# Patient Record
Sex: Female | Born: 1950
Health system: Southern US, Community
[De-identification: ages and names within clinical notes are randomized; demographics above are authoritative.]

## PROBLEM LIST (undated history)

## (undated) DIAGNOSIS — G4700 Insomnia, unspecified: Secondary | ICD-10-CM

## (undated) DIAGNOSIS — R480 Dyslexia and alexia: Secondary | ICD-10-CM

## (undated) DIAGNOSIS — E785 Hyperlipidemia, unspecified: Secondary | ICD-10-CM

## (undated) DIAGNOSIS — Z8719 Personal history of other diseases of the digestive system: Secondary | ICD-10-CM

## (undated) DIAGNOSIS — I10 Essential (primary) hypertension: Secondary | ICD-10-CM

## (undated) DIAGNOSIS — Z860101 Personal history of adenomatous and serrated colon polyps: Secondary | ICD-10-CM

## (undated) DIAGNOSIS — E039 Hypothyroidism, unspecified: Secondary | ICD-10-CM

## (undated) DIAGNOSIS — M8580 Other specified disorders of bone density and structure, unspecified site: Secondary | ICD-10-CM

## (undated) DIAGNOSIS — R609 Edema, unspecified: Secondary | ICD-10-CM

## (undated) DIAGNOSIS — Z86010 Personal history of colonic polyps: Secondary | ICD-10-CM

## (undated) HISTORY — PX: ABDOMINAL HYSTERECTOMY: SHX81

## (undated) HISTORY — DX: Essential (primary) hypertension: I10

## (undated) HISTORY — DX: Personal history of adenomatous and serrated colon polyps: Z86.0101

## (undated) HISTORY — DX: Other specified disorders of bone density and structure, unspecified site: M85.80

## (undated) HISTORY — DX: Personal history of colonic polyps: Z86.010

## (undated) HISTORY — DX: Hyperlipidemia, unspecified: E78.5

## (undated) HISTORY — DX: Hypothyroidism, unspecified: E03.9

## (undated) HISTORY — DX: Personal history of other diseases of the digestive system: Z87.19

## (undated) HISTORY — DX: Insomnia, unspecified: G47.00

## (undated) HISTORY — DX: Dyslexia and alexia: R48.0

## (undated) HISTORY — DX: Edema, unspecified: R60.9

---

## 2003-01-04 ENCOUNTER — Emergency Department (HOSPITAL_COMMUNITY): Admission: EM | Admit: 2003-01-04 | Discharge: 2003-01-04 | Payer: Self-pay | Admitting: Emergency Medicine

## 2003-01-04 ENCOUNTER — Encounter: Payer: Self-pay | Admitting: Emergency Medicine

## 2003-09-09 ENCOUNTER — Encounter: Admission: RE | Admit: 2003-09-09 | Discharge: 2003-09-09 | Payer: Self-pay | Admitting: Internal Medicine

## 2004-08-10 ENCOUNTER — Ambulatory Visit: Payer: Self-pay | Admitting: Internal Medicine

## 2004-08-14 ENCOUNTER — Ambulatory Visit: Payer: Self-pay | Admitting: Internal Medicine

## 2004-08-31 ENCOUNTER — Ambulatory Visit: Payer: Self-pay | Admitting: Internal Medicine

## 2005-04-28 ENCOUNTER — Emergency Department (HOSPITAL_COMMUNITY): Admission: EM | Admit: 2005-04-28 | Discharge: 2005-04-28 | Payer: Self-pay | Admitting: Emergency Medicine

## 2006-03-31 ENCOUNTER — Ambulatory Visit: Payer: Self-pay | Admitting: Internal Medicine

## 2006-09-26 ENCOUNTER — Other Ambulatory Visit: Admission: RE | Admit: 2006-09-26 | Discharge: 2006-09-26 | Payer: Self-pay | Admitting: Internal Medicine

## 2007-02-11 ENCOUNTER — Encounter: Admission: RE | Admit: 2007-02-11 | Discharge: 2007-02-11 | Payer: Self-pay | Admitting: Internal Medicine

## 2008-05-11 IMAGING — MG MM SCREEN MAMMOGRAM BILATERAL
1 series · 1 of 1 positions shown · non-contrast
Comparison: none

DG SCREEN MAMMOGRAM BILATERAL
Bilateral CC and MLO view(s) were taken.
Technologist: Akshar Chaidez

DIGITAL SCREENING MAMMOGRAM WITH CAD:
There is a fibroglandular pattern.  No masses or malignant type calcifications are identified.  
Compared with prior studies.

[L MLO]
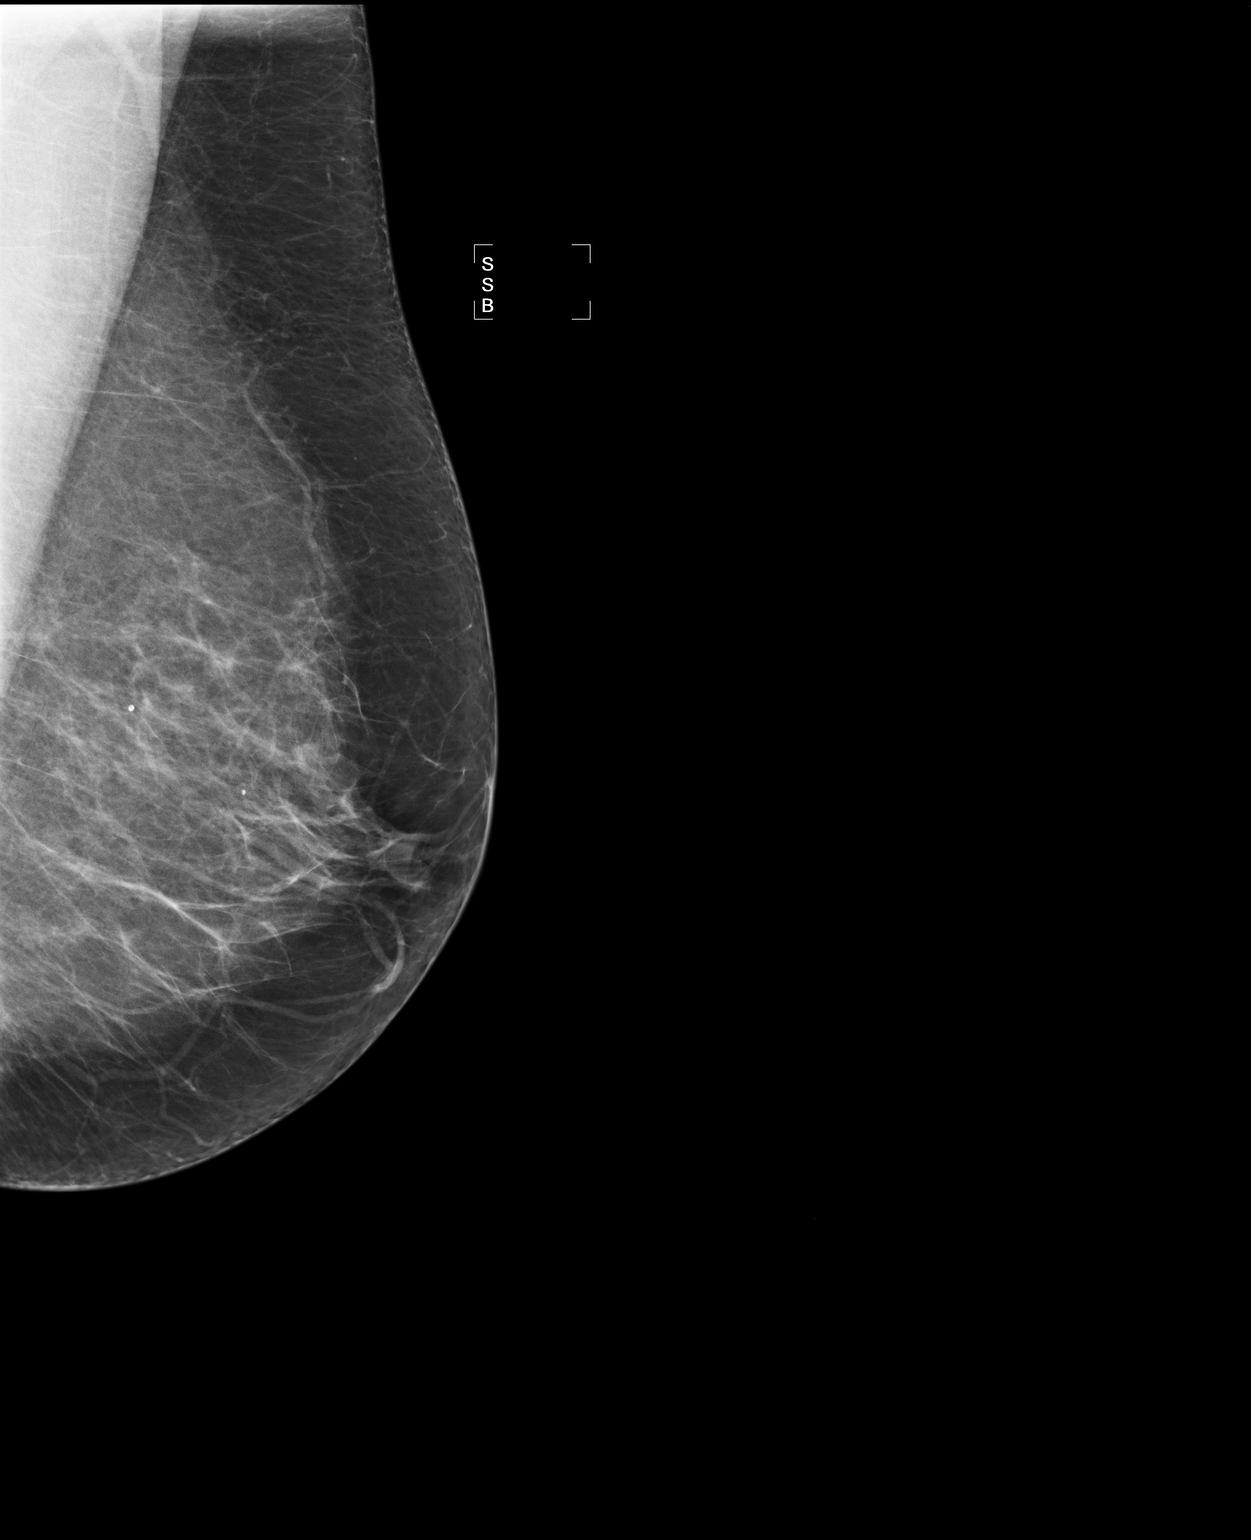

[1 of 1 positions shown; findings below may reference images not displayed]

IMPRESSION: No specific mammographic evidence of malignancy.  Next screening mammogram is recommended in one 
year.

ASSESSMENT: Negative - BI-RADS 1

Screening mammogram in 1 year.
ANALYZED BY COMPUTER AIDED DETECTION. , THIS PROCEDURE WAS A DIGITAL MAMMOGRAM.

## 2009-03-09 ENCOUNTER — Ambulatory Visit: Payer: Self-pay | Admitting: Internal Medicine

## 2009-03-14 ENCOUNTER — Ambulatory Visit: Payer: Self-pay | Admitting: Internal Medicine

## 2010-06-15 ENCOUNTER — Encounter: Admission: RE | Admit: 2010-06-15 | Discharge: 2010-06-15 | Payer: Self-pay | Admitting: Internal Medicine

## 2010-06-29 ENCOUNTER — Ambulatory Visit: Payer: Self-pay | Admitting: Internal Medicine

## 2010-09-03 ENCOUNTER — Ambulatory Visit: Payer: Self-pay | Admitting: Internal Medicine

## 2010-10-15 ENCOUNTER — Ambulatory Visit
Admission: RE | Admit: 2010-10-15 | Discharge: 2010-10-15 | Payer: Self-pay | Source: Home / Self Care | Attending: Internal Medicine | Admitting: Internal Medicine

## 2010-11-09 ENCOUNTER — Ambulatory Visit (INDEPENDENT_AMBULATORY_CARE_PROVIDER_SITE_OTHER): Payer: BC Managed Care – PPO | Admitting: Internal Medicine

## 2010-11-09 DIAGNOSIS — I10 Essential (primary) hypertension: Secondary | ICD-10-CM

## 2011-02-21 ENCOUNTER — Other Ambulatory Visit: Payer: BC Managed Care – PPO | Admitting: Internal Medicine

## 2011-02-21 DIAGNOSIS — Z0000 Encounter for general adult medical examination without abnormal findings: Secondary | ICD-10-CM

## 2011-02-21 DIAGNOSIS — E7800 Pure hypercholesterolemia, unspecified: Secondary | ICD-10-CM

## 2011-02-21 DIAGNOSIS — Z79899 Other long term (current) drug therapy: Secondary | ICD-10-CM

## 2011-02-21 LAB — COMPREHENSIVE METABOLIC PANEL
ALT: 29 U/L (ref 0–35)
AST: 30 U/L (ref 0–37)
Albumin: 4.3 g/dL (ref 3.5–5.2)
Alkaline Phosphatase: 70 U/L (ref 39–117)
BUN: 12 mg/dL (ref 6–23)
CO2: 28 mEq/L (ref 19–32)
Calcium: 9.5 mg/dL (ref 8.4–10.5)
Chloride: 104 mEq/L (ref 96–112)
Creat: 0.82 mg/dL (ref 0.40–1.20)
Glucose, Bld: 99 mg/dL (ref 70–99)
Potassium: 4.4 mEq/L (ref 3.5–5.3)
Sodium: 139 mEq/L (ref 135–145)
Total Bilirubin: 0.5 mg/dL (ref 0.3–1.2)
Total Protein: 6.4 g/dL (ref 6.0–8.3)

## 2011-02-21 LAB — TSH: TSH: 2.663 u[IU]/mL (ref 0.350–4.500)

## 2011-02-21 LAB — LIPID PANEL
Cholesterol: 205 mg/dL — ABNORMAL HIGH (ref 0–200)
HDL: 55 mg/dL (ref >39)
LDL Cholesterol: 132 mg/dL — ABNORMAL HIGH (ref 0–99)
Total CHOL/HDL Ratio: 3.7 Ratio
Triglycerides: 88 mg/dL (ref <150)
VLDL: 18 mg/dL (ref 0–40)

## 2011-02-22 ENCOUNTER — Ambulatory Visit (INDEPENDENT_AMBULATORY_CARE_PROVIDER_SITE_OTHER): Payer: BC Managed Care – PPO | Admitting: Internal Medicine

## 2011-02-22 ENCOUNTER — Encounter: Payer: Self-pay | Admitting: Internal Medicine

## 2011-02-22 DIAGNOSIS — Z0000 Encounter for general adult medical examination without abnormal findings: Secondary | ICD-10-CM

## 2011-02-22 DIAGNOSIS — G4700 Insomnia, unspecified: Secondary | ICD-10-CM

## 2011-02-22 DIAGNOSIS — M8580 Other specified disorders of bone density and structure, unspecified site: Secondary | ICD-10-CM

## 2011-02-22 DIAGNOSIS — I10 Essential (primary) hypertension: Secondary | ICD-10-CM | POA: Insufficient documentation

## 2011-02-22 DIAGNOSIS — M899 Disorder of bone, unspecified: Secondary | ICD-10-CM

## 2011-02-22 DIAGNOSIS — R480 Dyslexia and alexia: Secondary | ICD-10-CM | POA: Insufficient documentation

## 2011-02-22 DIAGNOSIS — R609 Edema, unspecified: Secondary | ICD-10-CM

## 2011-02-22 LAB — POCT URINALYSIS DIPSTICK
Bilirubin, UA: NEGATIVE
Blood, UA: NEGATIVE
Glucose, UA: NEGATIVE
Ketones, UA: NEGATIVE
Leukocytes, UA: NEGATIVE
Nitrite, UA: NEGATIVE
Protein, UA: NEGATIVE
Spec Grav, UA: 1
Urobilinogen, UA: NEGATIVE
pH, UA: 7

## 2011-03-23 NOTE — Patient Instructions (Signed)
Continue medications as prescribed. Return to office in 6 months

## 2011-03-23 NOTE — Progress Notes (Signed)
  Subjective:    Patient ID: Anna Sanchez, female    DOB: 1951-08-23, 60 y.o.   MRN: [id number]  HPI pleasant white female for evaluation of medical problems. In December 2011 we started her on Cozaar 50 mg daily. Her blood pressure was running 146-160 systolically and 88-90 diastolically. In January 2012, we increase Cozaar to 100 mg daily. Blood pressure came under good control and was 136/88 in February 2012. Patient has history of left plantar fasciitis. Says she had depression 5 years ago but is fine now. She works as a Agricultural engineer. Total abdominal hysterectomy about lateral salpingo-oophorectomy around 1990. Tetanus immunization given 2006. No known drug allergies   Review of Systems  Constitutional: Negative.   HENT: Negative.   Eyes: Negative.   Respiratory: Negative.   Cardiovascular: Negative.   Gastrointestinal: Negative.   Genitourinary: Negative.   Musculoskeletal: Negative.   Neurological: Negative.   Hematological: Negative.   Psychiatric/Behavioral: Negative.        Objective:   Physical Exam  Constitutional: She is oriented to person, place, and time. No distress.  HENT:  Head: Normocephalic and atraumatic.  Right Ear: External ear normal.  Left Ear: External ear normal.  Mouth/Throat: Oropharynx is clear and moist.  Eyes: Conjunctivae and EOM are normal. Pupils are equal, round, and reactive to light. No scleral icterus.  Neck: Neck supple. No JVD present. No thyromegaly present.  Cardiovascular: Normal rate, regular rhythm and normal heart sounds.   No murmur heard. Pulmonary/Chest: Effort normal and breath sounds normal. She has no wheezes. She has no rales.  Abdominal: Soft. Bowel sounds are normal. She exhibits no mass. There is no tenderness.  Musculoskeletal: She exhibits no edema.  Lymphadenopathy:    She has no cervical adenopathy.  Neurological: She is alert and oriented to person, place, and time. She has normal reflexes. No cranial nerve deficit.  Coordination normal.  Skin: Skin is warm and dry. No rash noted.  Psychiatric: She has a normal mood and affect. Her behavior is normal. Thought content normal.          Assessment & Plan:  Hypertension  Osteopenia treat with vitamin D supplement and calcium supplement bone density study done in 2008 consider repeating next year  Dependent edema-stable  Insomnia  Plan is to return in 6 months for repeat blood pressure check and office visit

## 2011-04-22 ENCOUNTER — Other Ambulatory Visit: Payer: Self-pay | Admitting: Internal Medicine

## 2011-05-28 ENCOUNTER — Other Ambulatory Visit: Payer: BC Managed Care – PPO | Admitting: Internal Medicine

## 2011-05-28 DIAGNOSIS — E785 Hyperlipidemia, unspecified: Secondary | ICD-10-CM

## 2011-05-28 LAB — LIPID PANEL
Cholesterol: 187 mg/dL (ref 0–200)
HDL: 53 mg/dL (ref >39)
LDL Cholesterol: 113 mg/dL — ABNORMAL HIGH (ref 0–99)
Total CHOL/HDL Ratio: 3.5 Ratio
Triglycerides: 105 mg/dL (ref <150)
VLDL: 21 mg/dL (ref 0–40)

## 2011-05-30 ENCOUNTER — Encounter: Payer: Self-pay | Admitting: Internal Medicine

## 2011-07-23 ENCOUNTER — Other Ambulatory Visit: Payer: Self-pay | Admitting: Internal Medicine

## 2011-08-26 ENCOUNTER — Ambulatory Visit: Payer: BC Managed Care – PPO | Admitting: Internal Medicine

## 2011-09-06 ENCOUNTER — Encounter: Payer: Self-pay | Admitting: Internal Medicine

## 2011-09-06 ENCOUNTER — Ambulatory Visit (INDEPENDENT_AMBULATORY_CARE_PROVIDER_SITE_OTHER): Payer: BC Managed Care – PPO | Admitting: Internal Medicine

## 2011-09-06 DIAGNOSIS — I10 Essential (primary) hypertension: Secondary | ICD-10-CM

## 2011-09-06 DIAGNOSIS — E785 Hyperlipidemia, unspecified: Secondary | ICD-10-CM

## 2011-09-13 IMAGING — MG MM DIGITAL SCREENING
6 series · 6 of 6 positions shown · non-contrast
Comparison: none

DG SCREEN MAMMOGRAM BILATERAL
Bilateral CC and MLO view(s) were taken.

DIGITAL SCREENING MAMMOGRAM WITH CAD:
There are scattered fibroglandular densities.  No masses or malignant type calcifications are 
identified.  Compared with prior studies.
Images were processed with CAD.

[R CC]
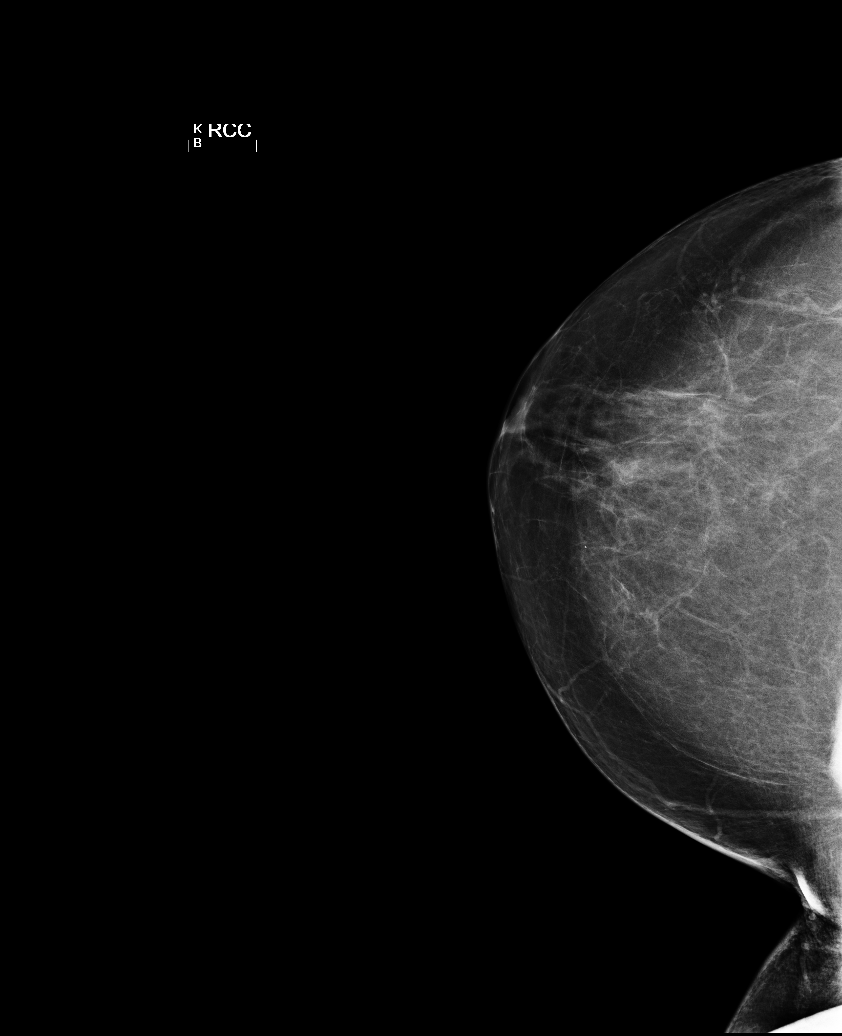

[L CC]
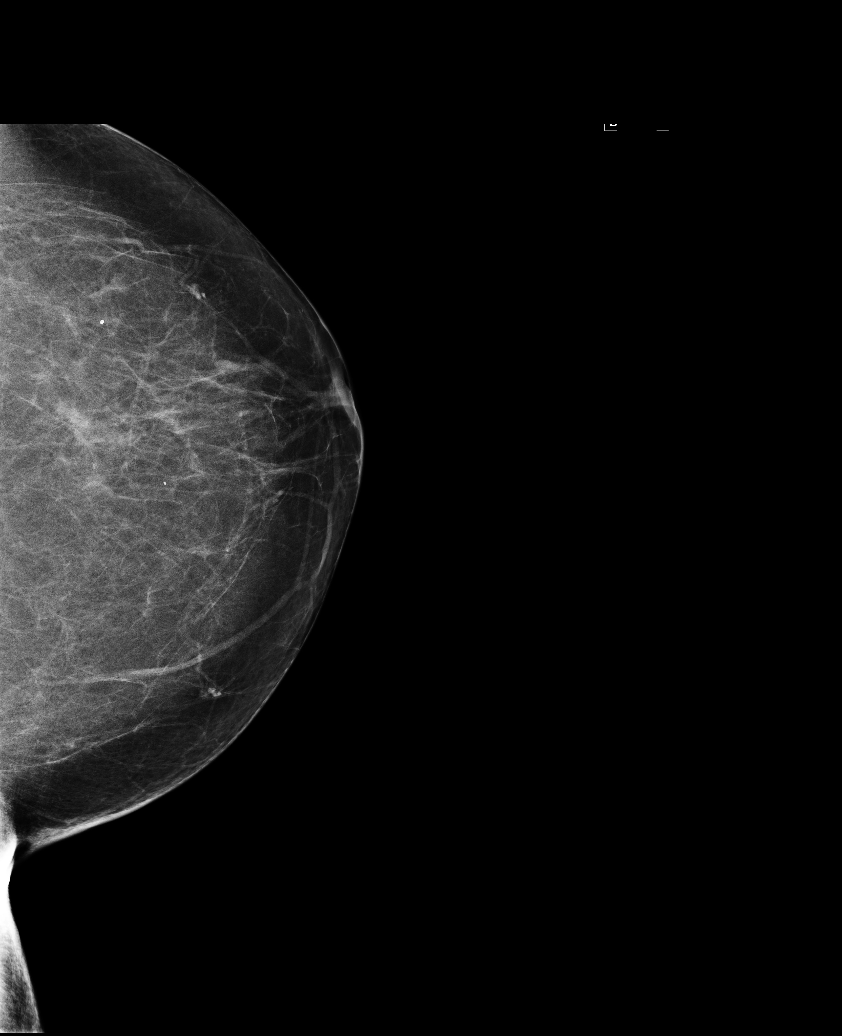

[L MLO]
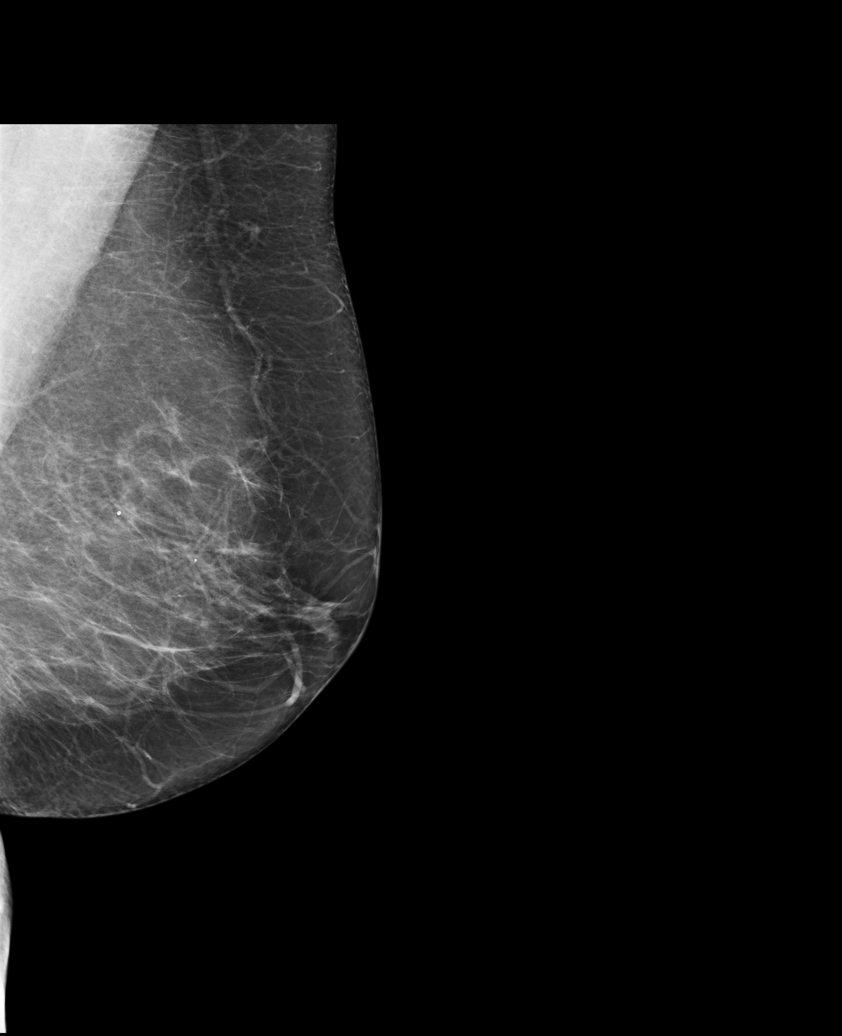

[R MLO (1 of 3)]
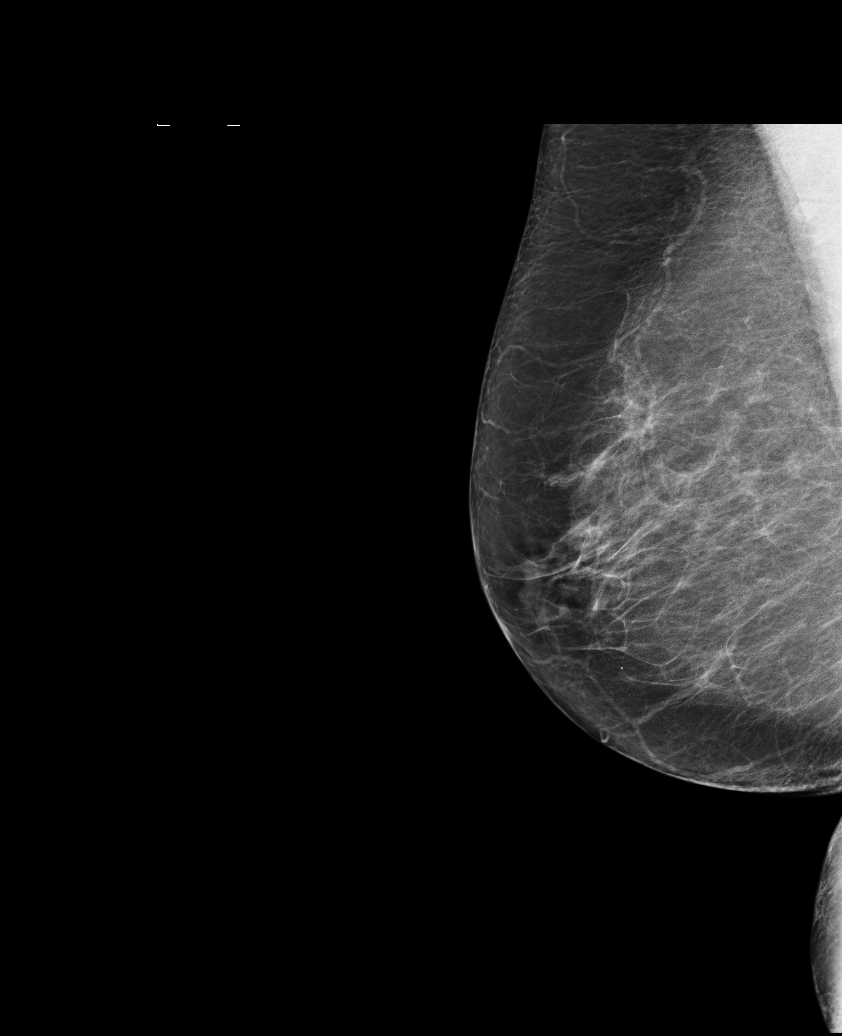

[R MLO (2 of 3)]
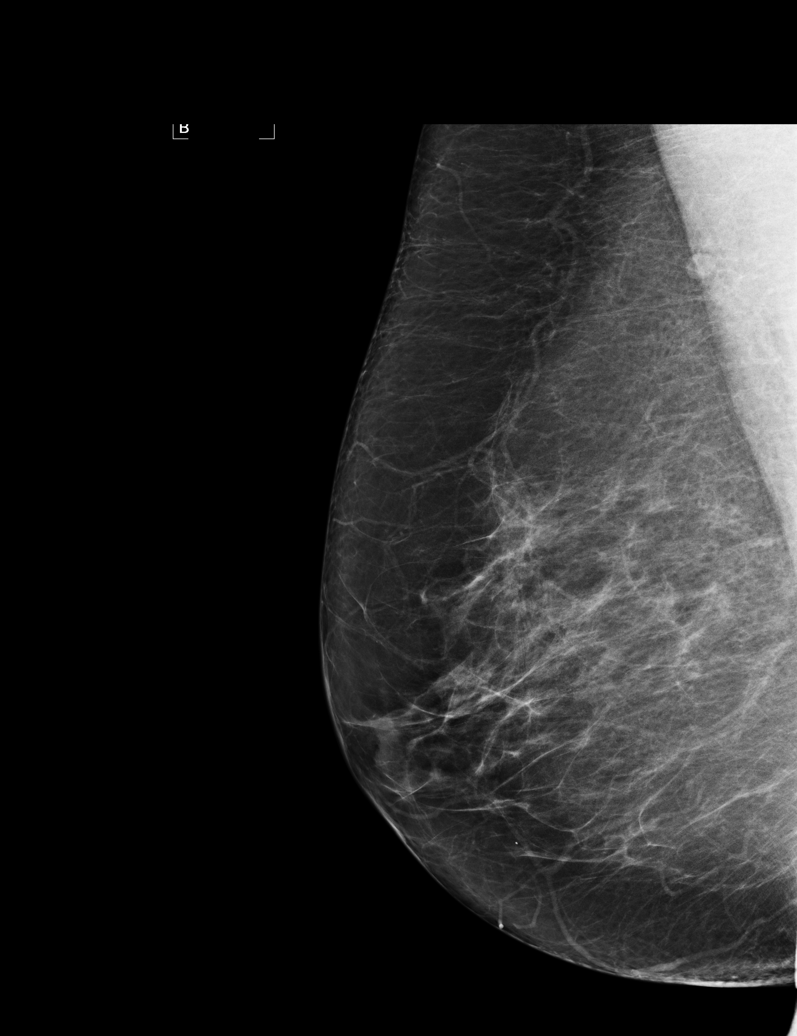

[R MLO (3 of 3)]
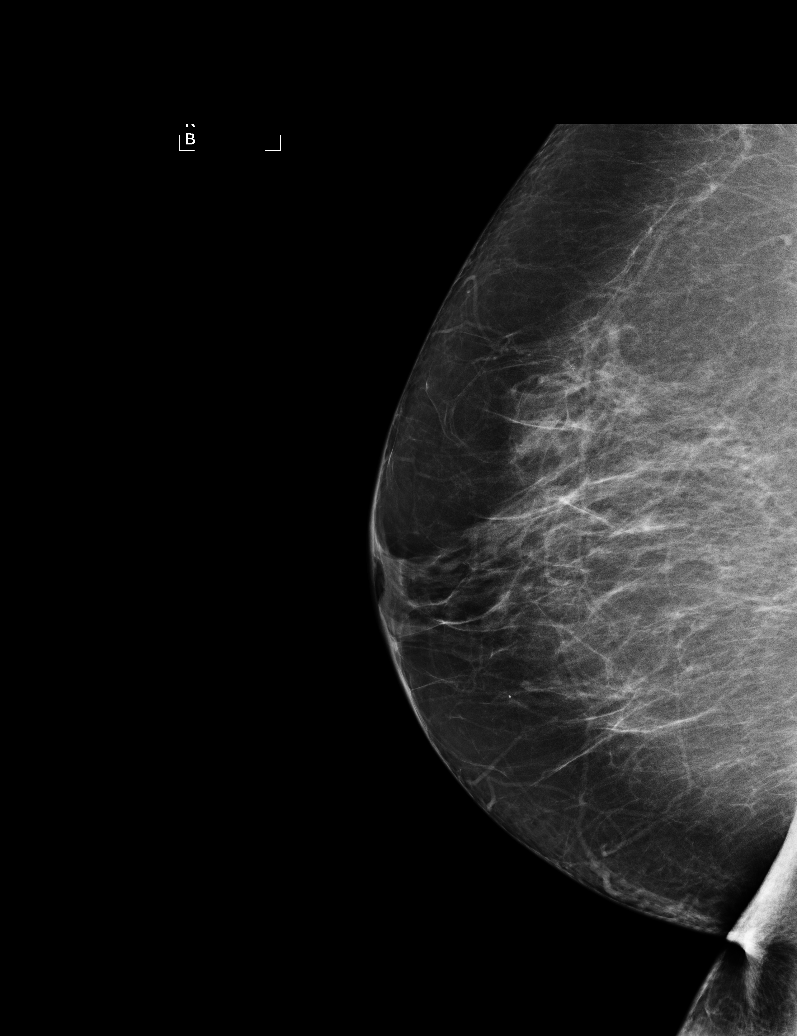

[6 of 6 positions shown; findings below may reference images not displayed]

IMPRESSION: No specific mammographic evidence of malignancy.  Next screening mammogram is recommended in one 
year.

A result letter of this screening mammogram will be mailed directly to the patient.

ASSESSMENT: Negative - BI-RADS 1

Screening mammogram in 1 year.
,

## 2011-09-22 ENCOUNTER — Encounter: Payer: Self-pay | Admitting: Internal Medicine

## 2011-09-22 DIAGNOSIS — E785 Hyperlipidemia, unspecified: Secondary | ICD-10-CM | POA: Insufficient documentation

## 2011-09-22 NOTE — Patient Instructions (Signed)
Continue antihypertensive medication as prescribed. Continue diet and exercise. Return in 6 months

## 2011-09-22 NOTE — Progress Notes (Signed)
  Subjective:    Patient ID: Anna Sanchez, female    DOB: Feb 12, 1951, 60 y.o.   MRN: [id number]  HPI 60 year old white female with history of hypertension in today for six-month recheck. Also has history of hyperlipidemia currently controlled with diet. Feels well with no complaints    Review of Systems     Objective:   Physical Exam chest clear to auscultation; cardiac exam regular rate and rhythm normal S1 and S2 without murmur; extremities without edema        Assessment & Plan:  Hypertension  Hyperlipidemia  Plan: continue current regimen and return in 6 months for physical exam.

## 2011-10-23 ENCOUNTER — Other Ambulatory Visit: Payer: Self-pay | Admitting: Internal Medicine

## 2012-02-19 ENCOUNTER — Encounter (HOSPITAL_COMMUNITY): Payer: Self-pay

## 2012-02-19 ENCOUNTER — Emergency Department (HOSPITAL_COMMUNITY)
Admission: EM | Admit: 2012-02-19 | Discharge: 2012-02-19 | Disposition: A | Discharge status: Home / Self Care | Payer: BC Managed Care – PPO | Source: Home / Self Care | Attending: Emergency Medicine | Admitting: Emergency Medicine

## 2012-02-19 DIAGNOSIS — T148XXA Other injury of unspecified body region, initial encounter: Secondary | ICD-10-CM

## 2012-02-19 DIAGNOSIS — W57XXXA Bitten or stung by nonvenomous insect and other nonvenomous arthropods, initial encounter: Secondary | ICD-10-CM

## 2012-02-19 NOTE — ED Notes (Signed)
States she found 6 attached ticks yesterday, could not get in to see her MD today; concerned she needs to be on medication to prevent RMSF

## 2012-02-19 NOTE — Discharge Instructions (Signed)
Wood Tick Bite Ticks are insects that attach themselves to the skin. Most tick bites are harmless, but sometimes ticks carry diseases that can make a person quite ill. The chance of getting ill depends on:  The kind of tick that bites you.   Time of year.   How long the tick is attached.   Geographic location.  Wood ticks are also called dog ticks. They are generally black. They can have white markings. They live in shrubs and grassy areas. They are larger than deer ticks. Wood ticks are about the size of a watermelon seed. They have a hard body. The most common places for ticks to attach themselves are the scalp, neck, armpits, waist, and groin. Wood ticks may stay attached for up to 2 weeks. TICKS MUST BE REMOVED AS SOON AS POSSIBLE TO HELP PREVENT DISEASES CAUSED BY TICK BITES.  TO REMOVE A TICK: 1. If available, put on latex gloves before trying to remove a tick.  2. Grasp the tick as close to the skin as possible, with curved forceps, fine tweezers or a special tick removal tool.  3. Pull gently with steady pressure until the tick lets go. Do not twist the tick or jerk it suddenly. This may break off the tick's head or mouth parts.  4. Do not crush the tick's body. This could force disease-carrying fluids from the tick into your body.  5. After the tick is removed, wash the bite area and your hands with soap and water or other disinfectant.  6. Apply a small amount of antiseptic cream or ointment to the bite site.  7. Wash and disinfect any instruments that were used.  8. Save the tick in a jar or plastic bag for later identification. Preserve the tick with a bit of alcohol or put it in the freezer.  9. Do not apply a hot match, petroleum jelly, or fingernail polish to the tick. This does not work and may increase the chances of disease from the tick bite.  YOU MAY NEED TO SEE YOUR CAREGIVER FOR A TETANUS SHOT NOW IF:  You have no idea when you had the last one.   You have never had a  tetanus shot before.  If you need a tetanus shot, and you decide not to get one, there is a rare chance of getting tetanus. Sickness from tetanus can be serious. If you get a tetanus shot, your arm may swell, get red and warm to the touch at the shot site. This is common and not a problem. PREVENTION  Wear protective clothing. Long sleeves and pants are best.   Wear white clothes to see ticks more easily   Tuck your pant legs into your socks.   If walking on trail, stay in the middle of the trail to avoid brushing against bushes.   Put insect repellent on all exposed skin and along boot tops, pant legs and sleeve cuffs   Check clothing, hair and skin repeatedly and before coming inside.   Brush off any ticks that are not attached.  SEEK MEDICAL CARE IF:   You cannot remove a tick or part of the tick that is left in the skin.   Unexplained fever.   Redness and swelling in the area of the tick bite.   Tender, swollen lymph glands.   Diarrhea.   Weight loss.   Cough.   Fatigue.   Muscle, joint or bone pain.   Belly pain.   Headache.   Rash.    SEEK IMMEDIATE MEDICAL CARE IF:   You develop an oral temperature above 102 F (38.9 C).   You are having trouble walking or moving your legs.   Numbness in the legs.   Shortness of breath.   Confusion.   Repeated vomiting.  Document Released: 09/06/2000 Document Revised: 08/29/2011 Document Reviewed: 08/15/2008 Exit[employer] Patient Information 2012 [employer].  Deer Tick Bite Deer ticks are brown arachnids (spider family) that vary in size from as small as the head of a pin to 1/4 inch (1/2 cm) diameter. They thrive in wooded areas. Deer are the preferred host of adult deer ticks. Small rodents are the host of young ticks (nymphs). When a person walks in a field or wooded area, young and adult ticks in the surrounding grass and vegetation can attach themselves to the skin. They can suck blood for hours to days if  unnoticed. Ticks are found all over the [place] Some ticks carry a specific bacteria (Borrelia burgdorferi) that causes an infection called Lyme disease. The bacteria is typically passed into a person during the blood sucking process. This happens after the tick has been attached for at least a number of hours. While ticks can be found all over the [place] those carrying the bacteria that causes Lyme disease are most common in [place] and the [place]. Only a small proportion of ticks in these areas carry the Lyme disease bacteria and cause human infections. Ticks usually attach to warm spots on the body, such as the:  Head.   Back.   Neck.   Armpits.   Groin.  SYMPTOMS  Most of the time, a deer tick bite will not be felt. You may or may not see the attached tick. You may notice mild irritation or redness around the bite site. If the deer tick passes the Lyme disease bacteria to a person, a round, red rash may be noticed 2 to 3 days after the bite. The rash may be clear in the middle, like a bull's-eye or target. If not treated, other symptoms may develop several days to weeks after the onset of the rash. These symptoms may include:  New rash lesions.   Fatigue and weakness.   General ill feeling and achiness.   Chills.   Headache and neck pain.   Swollen lymph glands.   Sore muscles and joints.  5 to 15% of untreated people with Lyme disease may develop more severe illnesses after several weeks to months. This may include inflammation of the brain lining (meningitis), nerve palsies, an abnormal heartbeat, or severe muscle and joint pain and inflammation (myositis or arthritis). DIAGNOSIS   Physical exam and medical history.   Viewing the tick if it was saved for confirmation.   Blood tests (to check or confirm the presence of Lyme disease).  TREATMENT  Most ticks do not carry disease. If found, an attached tick should be removed using tweezers. Tweezers should be placed under  the body of the tick so it is removed by its attachment parts (pincers). If there are signs or symptoms of being sick, or Lyme disease is confirmed, medicines (antibiotics) that kill germs are usually prescribed. In more severe cases, antibiotics may be given through an intravenous (IV) access. HOME CARE INSTRUCTIONS   Always remove ticks with tweezers. Do not use petroleum jelly or other methods to kill or remove the tick. Slide the tweezers under the body and pull out as much as you can. If you are not sure what it is,   save it in a jar and show your caregiver.   Once you remove the tick, the skin will heal on its own. Wash your hands and the affected area with water and soap. You may place a bandage on the affected area.   Take medicine as directed. You may be advised to take a full course of antibiotics.   Follow up with your caregiver as recommended.  FINDING OUT THE RESULTS OF YOUR TEST Not all test results are available during your visit. If your test results are not back during the visit, make an appointment with your caregiver to find out the results. Do not assume everything is normal if you have not heard from your caregiver or the medical facility. It is important for you to follow up on all of your test results. PROGNOSIS  If Lyme disease is confirmed, early treatment with antibiotics is very effective. Following preventive guidelines is important since it is possible to get the disease more than once. PREVENTION   Wear long sleeves and long pants in wooded or grassy areas. Tuck your pants into your socks.   Use an insect repellent while hiking.   Check yourself, your children, and your pets regularly for ticks after playing outside.   Clear piles of leaves or brush from your yard. Ticks might live there.  SEEK MEDICAL CARE IF:   You or your child has an oral temperature above 102 F (38.9 C).   You develop a severe headache following the bite.   You feel generally ill.    You notice a rash.   You are having trouble removing the tick.   The bite area has red skin or yellow drainage.  SEEK IMMEDIATE MEDICAL CARE IF:   Your face is weak and droopy or you have other neurological symptoms.   You have severe joint pain or weakness.  MAKE SURE YOU:   Understand these instructions.   Will watch your condition.   Will get help right away if you are not doing well or get worse.  FOR MORE INFORMATION Centers for Disease Control and Prevention: www.cdc.gov American Academy of Family Physicians: www.aafp.org Document Released: 12/04/2009 Document Revised: 08/29/2011 Document Reviewed: 12/04/2009 [employer] Patient Information 2012 [employer]. 

## 2012-02-19 NOTE — ED Provider Notes (Signed)
History     CSN: [id number]  Arrival date & time 02/19/12  4098   First MD Initiated Contact with Patient 02/19/12 1038      Chief Complaint  Patient presents with  . Tick Removal    (Consider location/radiation/quality/duration/timing/severity/associated sxs/prior treatment) Patient is a 61 y.o. female presenting with rash. The history is provided by the patient. No language interpreter was used.  Rash  This is a new problem. The current episode started yesterday. The problem has not changed since onset.The problem is associated with nothing. There has been no fever. The rash is present on the torso, back and abdomen. The pain is at a severity of 0/10. She has tried nothing for the symptoms.  Pt has red spots where she pulled off ticks.  Pt reports ticks were small.  Pt was in [employer]  Past Medical History  Diagnosis Date  . Dyslexia   . Insomnia   . Osteopenia   . Hypertension   . Dependent edema     Past Surgical History  Procedure Date  . Abdominal hysterectomy     Family History  Problem Relation Age of Onset  . Diabetes Father   . Alcohol abuse Father     History  Substance Use Topics  . Smoking status: Former Smoker -- 1.0 packs/day for 33 years    Types: Cigarettes    Quit date: 02/20/2000  . Smokeless tobacco: Former Neurosurgeon    Quit date: 10/12/2010  . Alcohol Use: No    OB History    Grav Para Term Preterm Abortions TAB SAB Ect Mult Living                  Review of Systems  Skin: Negative for rash.  All other systems reviewed and are negative.    Allergies  Review of patient's allergies indicates no known allergies.  Home Medications   Current Outpatient Rx  Name Route Sig Dispense Refill  . ASPIRIN 81 MG PO CHEW Oral Chew 81 mg by mouth daily. 2 tabs Qhs     . B COMPLEX PO TABS Oral Take 1 tablet by mouth daily.      Marland Kitchen CALCIUM MAGNESIUM PO Oral Take 1 tablet by mouth daily.      . IBUPROFEN 200 MG PO TABS Oral Take 200 mg by  mouth every 6 (six) hours as needed.      Marland Kitchen LOSARTAN POTASSIUM 100 MG PO TABS  TAKE 1 TABLET BY MOUTH EVERY DAY FOR BLOOD PRESSURE 30 tablet 6  . ONE-DAILY MULTI VITAMINS PO TABS Oral Take 1 tablet by mouth daily.      Marland Kitchen FISH OIL 1000 MG PO CAPS Oral Take 1 capsule by mouth daily.        BP 121/67  Pulse 64  Temp(Src) 98.7 F (37.1 C) (Oral)  Resp 10  SpO2 100%  Physical Exam  Nursing note and vitals reviewed. Constitutional: She is oriented to person, place, and time. She appears well-developed and well-nourished.  HENT:  Head: Normocephalic.  Musculoskeletal: Normal range of motion.  Neurological: She is alert and oriented to person, place, and time. She has normal reflexes.  Skin: Skin is warm and dry.       Small red areas at sites of removal of ticks.  No rash,  Psychiatric: She has a normal mood and affect.    ED Course  Procedures (including critical care time)  Labs Reviewed - No data to display No results found.  No diagnosis found.    MDM  Pt counseled on watching for tick related illness.  Pt advised to follow up with Dr. Lenord Fellers if fever, headache, rash        Lonia Skinner [place], [place] 02/19/12 1059  Lonia Skinner [place], [place] 02/20/12 [phone]

## 2012-02-26 NOTE — ED Provider Notes (Signed)
Medical screening examination/treatment/procedure(s) were performed by non-physician practitioner and as supervising physician I was immediately available for consultation/collaboration.  Luiz Blare MD   Luiz Blare, MD 02/26/12 2122

## 2012-03-09 ENCOUNTER — Other Ambulatory Visit: Payer: BC Managed Care – PPO | Admitting: Internal Medicine

## 2012-03-10 ENCOUNTER — Encounter: Payer: BC Managed Care – PPO | Admitting: Internal Medicine

## 2012-04-24 ENCOUNTER — Other Ambulatory Visit: Payer: BC Managed Care – PPO | Admitting: Internal Medicine

## 2012-04-24 DIAGNOSIS — E785 Hyperlipidemia, unspecified: Secondary | ICD-10-CM

## 2012-04-24 DIAGNOSIS — I10 Essential (primary) hypertension: Secondary | ICD-10-CM

## 2012-04-24 DIAGNOSIS — Z0000 Encounter for general adult medical examination without abnormal findings: Secondary | ICD-10-CM

## 2012-04-24 LAB — CBC WITH DIFFERENTIAL/PLATELET
Basophils Absolute: 0.1 10*3/uL (ref 0.0–0.1)
Basophils Relative: 1 % (ref 0–1)
Eosinophils Absolute: 0.1 10*3/uL (ref 0.0–0.7)
Eosinophils Relative: 2 % (ref 0–5)
HCT: 39.4 % (ref 36.0–46.0)
Hemoglobin: 13.3 g/dL (ref 12.0–15.0)
Lymphocytes Relative: 31 % (ref 12–46)
Lymphs Abs: 1.4 10*3/uL (ref 0.7–4.0)
MCH: 31 pg (ref 26.0–34.0)
MCHC: 33.8 g/dL (ref 30.0–36.0)
MCV: 91.8 fL (ref 78.0–100.0)
Monocytes Absolute: 0.3 10*3/uL (ref 0.1–1.0)
Monocytes Relative: 7 % (ref 3–12)
Neutro Abs: 2.5 10*3/uL (ref 1.7–7.7)
Neutrophils Relative %: 59 % (ref 43–77)
Platelets: 201 10*3/uL (ref 150–400)
RBC: 4.29 MIL/uL (ref 3.87–5.11)
RDW: 13.4 % (ref 11.5–15.5)
WBC: 4.3 10*3/uL (ref 4.0–10.5)

## 2012-04-24 LAB — COMPREHENSIVE METABOLIC PANEL
ALT: 23 U/L (ref 0–35)
AST: 27 U/L (ref 0–37)
Albumin: 4.4 g/dL (ref 3.5–5.2)
Alkaline Phosphatase: 74 U/L (ref 39–117)
BUN: 14 mg/dL (ref 6–23)
CO2: 29 mEq/L (ref 19–32)
Calcium: 9.2 mg/dL (ref 8.4–10.5)
Chloride: 104 mEq/L (ref 96–112)
Creat: 0.71 mg/dL (ref 0.50–1.10)
Glucose, Bld: 87 mg/dL (ref 70–99)
Potassium: 4.8 mEq/L (ref 3.5–5.3)
Sodium: 139 mEq/L (ref 135–145)
Total Bilirubin: 0.5 mg/dL (ref 0.3–1.2)
Total Protein: 6.3 g/dL (ref 6.0–8.3)

## 2012-04-24 LAB — LIPID PANEL
Cholesterol: 198 mg/dL (ref 0–200)
HDL: 48 mg/dL (ref >39)
LDL Cholesterol: 133 mg/dL — ABNORMAL HIGH (ref 0–99)
Total CHOL/HDL Ratio: 4.1 Ratio
Triglycerides: 86 mg/dL (ref <150)
VLDL: 17 mg/dL (ref 0–40)

## 2012-04-24 LAB — TSH: TSH: 3.348 u[IU]/mL (ref 0.350–4.500)

## 2012-04-25 LAB — VITAMIN D 25 HYDROXY (VIT D DEFICIENCY, FRACTURES): Vit D, 25-Hydroxy: 37 ng/mL (ref 30–89)

## 2012-04-27 ENCOUNTER — Ambulatory Visit (INDEPENDENT_AMBULATORY_CARE_PROVIDER_SITE_OTHER): Payer: BC Managed Care – PPO | Admitting: Internal Medicine

## 2012-04-27 ENCOUNTER — Encounter: Payer: Self-pay | Admitting: Internal Medicine

## 2012-04-27 VITALS — BP 130/82 | HR 76 | Temp 97.9°F | Ht 67.5 in | Wt 203.0 lb

## 2012-04-27 DIAGNOSIS — I10 Essential (primary) hypertension: Secondary | ICD-10-CM

## 2012-04-27 LAB — POCT URINALYSIS DIPSTICK
Bilirubin, UA: NEGATIVE
Blood, UA: NEGATIVE
Glucose, UA: NEGATIVE
Ketones, UA: NEGATIVE
Leukocytes, UA: NEGATIVE
Nitrite, UA: NEGATIVE
Protein, UA: NEGATIVE
Spec Grav, UA: <=1.005
Urobilinogen, UA: NEGATIVE
pH, UA: 7

## 2012-04-27 NOTE — Progress Notes (Signed)
Subjective:    Patient ID: Anna Sanchez, female    DOB: 1951/08/05, 61 y.o.   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 History of osteopenia and insomnia. Had bone density study in 2008. Takes calcium and vitamin D. Remote history of depression. History of left plantar fasciitis. Had total abdominal hysterectomy BSO around 1990. Does not want Pap of vaginal cuff today. Takes multivitamin, fish oil, B complex, ibuprofen and aspirin 81 mg daily. Eats a lot of fresh fruits and vegetables. Her diet is good. She does take probiotics. Says she has a history of dyslexia.  She is a native of [place]. She moved from [place] to [place] in the 1970s and subsequently moved to [place] in the 1980s. Says she had a flexible sigmoidoscopy in the 1980s at [facility]. Takes melatonin for insomnia. She tends to prefer natural alternatives to drugs.  She works as a Agricultural engineer.  She is single and has a 4 year college degree. She quit smoking around 2004 and formerly smoked a half to one pack per day for 33 years. Social alcohol consumption on occasion. She gets her exercise by working in yard.  Family history: Father with history of diabetes and died of cirrhosis of the liver at age 89, mother with history of nervous breakdown, suicide and headaches. Sister with history of suicide in her 11s. 2 sisters living one of which had a brain aneurysm and history of adult onset diabetes mellitus. One brother died in a motor cycle accident at age 34. One brother with history of hepatitis C. One brother with history of coronary artery disease and alcoholism. One brother with history of prostate cancer. 3 brothers living. 2 sisters living.   Review of Systems  Constitutional: Negative.   HENT: Negative.   Eyes: Negative.   Respiratory: Negative.   Cardiovascular:  Negative.   Gastrointestinal:       History of irritable bowel symptoms -eats a lot of fresh fruits and vegetables.  Genitourinary: Negative.   Musculoskeletal: Positive for back pain and arthralgias.  Neurological: Negative.   Psychiatric/Behavioral:       History of insomnia.       Objective:   Physical Exam  Vitals reviewed. Constitutional: She is oriented to person, place, and time. She appears well-developed and well-nourished. No distress.  HENT:  Head: Normocephalic and atraumatic.  Right Ear: External ear normal.  Left Ear: External ear normal.  Nose: Nose normal.  Mouth/Throat: Oropharynx is clear and moist.  Eyes: Conjunctivae and EOM are normal. Pupils are equal, round, and reactive to light. Right eye exhibits no discharge. Left eye exhibits no discharge. No scleral icterus.  Neck: Neck supple. No JVD present. No thyromegaly present.  Cardiovascular: Normal rate, regular rhythm, normal heart sounds and intact distal pulses.   No murmur heard. Pulmonary/Chest: Effort normal and breath sounds normal. No respiratory distress. She has no wheezes. She has no rales. She exhibits no tenderness.       Breasts normal female  Abdominal: Soft. Bowel sounds are normal. She exhibits no distension and no mass. There is no tenderness. There is no rebound and no guarding.  Genitourinary:       Last Pap 2008. Status post hysterectomy  Lymphadenopathy:    She has no cervical adenopathy.  Neurological: She is alert and oriented to person, place, and time.  She has normal reflexes. She displays normal reflexes. No cranial nerve deficit. Coordination normal.  Skin: Skin is warm. No rash noted. She is diaphoretic.  Psychiatric: She has a normal mood and affect. Her behavior is normal. Judgment and thought content normal.          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 Has refused colonoscopy. Spoke with her about zoster vaccination and she will consider it. Suggested 3 Hemoccult cards if she does not want to have colonoscopy. Had tetanus immunization July 2006.

## 2012-04-27 NOTE — Patient Instructions (Addendum)
Continue same meds and return in one year. Consider colonoscopy.

## 2012-05-25 ENCOUNTER — Other Ambulatory Visit: Payer: Self-pay | Admitting: Internal Medicine

## 2012-07-24 ENCOUNTER — Other Ambulatory Visit: Payer: Self-pay | Admitting: Internal Medicine

## 2012-07-24 DIAGNOSIS — Z1231 Encounter for screening mammogram for malignant neoplasm of breast: Secondary | ICD-10-CM

## 2012-09-08 ENCOUNTER — Ambulatory Visit
Admission: RE | Admit: 2012-09-08 | Discharge: 2012-09-08 | Disposition: A | Discharge status: Home / Self Care | Payer: BC Managed Care – PPO | Source: Ambulatory Visit | Attending: Internal Medicine | Admitting: Internal Medicine

## 2012-09-08 DIAGNOSIS — Z1231 Encounter for screening mammogram for malignant neoplasm of breast: Secondary | ICD-10-CM

## 2013-01-24 ENCOUNTER — Other Ambulatory Visit: Payer: Self-pay | Admitting: Internal Medicine

## 2013-01-24 NOTE — Telephone Encounter (Signed)
Can refill through August 21014. Last PE was 04/27/12. Agreed pt would be seen yearly then. Needs to book PE after 04/27/13. Cannot refill anymore unless has PE on book.

## 2013-01-24 NOTE — Telephone Encounter (Signed)
Refilled e-scribe #[id number] with 3 refills on 01/24/13

## 2013-01-25 ENCOUNTER — Other Ambulatory Visit: Payer: Self-pay

## 2013-01-25 MED ORDER — LOSARTAN POTASSIUM 100 MG PO TABS: 100.0000 mg | ORAL_TABLET | Freq: Every day | ORAL | Status: DC

## 2013-04-29 ENCOUNTER — Other Ambulatory Visit: Payer: BC Managed Care – PPO | Admitting: Internal Medicine

## 2013-04-30 ENCOUNTER — Encounter: Payer: BC Managed Care – PPO | Admitting: Internal Medicine

## 2013-05-14 ENCOUNTER — Other Ambulatory Visit: Payer: BC Managed Care – PPO | Admitting: Internal Medicine

## 2013-05-14 DIAGNOSIS — E785 Hyperlipidemia, unspecified: Secondary | ICD-10-CM

## 2013-05-14 DIAGNOSIS — Z1329 Encounter for screening for other suspected endocrine disorder: Secondary | ICD-10-CM

## 2013-05-14 DIAGNOSIS — I10 Essential (primary) hypertension: Secondary | ICD-10-CM

## 2013-05-14 DIAGNOSIS — Z130 Encounter for screening for diseases of the blood and blood-forming organs and certain disorders involving the immune mechanism: Secondary | ICD-10-CM

## 2013-05-14 DIAGNOSIS — M8580 Other specified disorders of bone density and structure, unspecified site: Secondary | ICD-10-CM

## 2013-05-14 LAB — COMPREHENSIVE METABOLIC PANEL
ALT: 23 U/L (ref 0–35)
AST: 25 U/L (ref 0–37)
Albumin: 4.2 g/dL (ref 3.5–5.2)
Alkaline Phosphatase: 72 U/L (ref 39–117)
BUN: 14 mg/dL (ref 6–23)
CO2: 28 mEq/L (ref 19–32)
Calcium: 9.3 mg/dL (ref 8.4–10.5)
Chloride: 105 mEq/L (ref 96–112)
Creat: 0.77 mg/dL (ref 0.50–1.10)
Glucose, Bld: 87 mg/dL (ref 70–99)
Potassium: 4.6 mEq/L (ref 3.5–5.3)
Sodium: 140 mEq/L (ref 135–145)
Total Bilirubin: 0.6 mg/dL (ref 0.3–1.2)
Total Protein: 6.3 g/dL (ref 6.0–8.3)

## 2013-05-14 LAB — CBC WITH DIFFERENTIAL/PLATELET
Basophils Absolute: 0.1 10*3/uL (ref 0.0–0.1)
Basophils Relative: 1 % (ref 0–1)
Eosinophils Absolute: 0.1 10*3/uL (ref 0.0–0.7)
Eosinophils Relative: 2 % (ref 0–5)
HCT: 40.2 % (ref 36.0–46.0)
Hemoglobin: 13.5 g/dL (ref 12.0–15.0)
Lymphocytes Relative: 29 % (ref 12–46)
Lymphs Abs: 1.4 10*3/uL (ref 0.7–4.0)
MCH: 31.2 pg (ref 26.0–34.0)
MCHC: 33.6 g/dL (ref 30.0–36.0)
MCV: 92.8 fL (ref 78.0–100.0)
Monocytes Absolute: 0.4 10*3/uL (ref 0.1–1.0)
Monocytes Relative: 8 % (ref 3–12)
Neutro Abs: 2.9 10*3/uL (ref 1.7–7.7)
Neutrophils Relative %: 60 % (ref 43–77)
Platelets: 197 10*3/uL (ref 150–400)
RBC: 4.33 MIL/uL (ref 3.87–5.11)
RDW: 13.6 % (ref 11.5–15.5)
WBC: 4.9 10*3/uL (ref 4.0–10.5)

## 2013-05-14 LAB — LIPID PANEL
Cholesterol: 203 mg/dL — ABNORMAL HIGH (ref 0–200)
HDL: 53 mg/dL (ref >39)
LDL Cholesterol: 130 mg/dL — ABNORMAL HIGH (ref 0–99)
Total CHOL/HDL Ratio: 3.8 Ratio
Triglycerides: 101 mg/dL (ref <150)
VLDL: 20 mg/dL (ref 0–40)

## 2013-05-15 LAB — TSH: TSH: 3.015 u[IU]/mL (ref 0.350–4.500)

## 2013-05-15 LAB — VITAMIN D 25 HYDROXY (VIT D DEFICIENCY, FRACTURES): Vit D, 25-Hydroxy: 44 ng/mL (ref 30–89)

## 2013-05-17 ENCOUNTER — Encounter: Payer: Self-pay | Admitting: Internal Medicine

## 2013-05-17 ENCOUNTER — Ambulatory Visit (INDEPENDENT_AMBULATORY_CARE_PROVIDER_SITE_OTHER): Payer: BC Managed Care – PPO | Admitting: Internal Medicine

## 2013-05-17 VITALS — BP 126/80 | HR 72 | Ht 67.5 in | Wt 204.0 lb

## 2013-05-17 DIAGNOSIS — M899 Disorder of bone, unspecified: Secondary | ICD-10-CM

## 2013-05-17 DIAGNOSIS — M8580 Other specified disorders of bone density and structure, unspecified site: Secondary | ICD-10-CM

## 2013-05-17 DIAGNOSIS — I10 Essential (primary) hypertension: Secondary | ICD-10-CM

## 2013-05-17 DIAGNOSIS — R480 Dyslexia and alexia: Secondary | ICD-10-CM

## 2013-05-17 DIAGNOSIS — G4700 Insomnia, unspecified: Secondary | ICD-10-CM

## 2013-05-17 DIAGNOSIS — Z0000 Encounter for general adult medical examination without abnormal findings: Secondary | ICD-10-CM

## 2013-05-17 DIAGNOSIS — E785 Hyperlipidemia, unspecified: Secondary | ICD-10-CM

## 2013-05-17 LAB — POCT URINALYSIS DIPSTICK
Bilirubin, UA: NEGATIVE
Blood, UA: NEGATIVE
Glucose, UA: NEGATIVE
Ketones, UA: NEGATIVE
Leukocytes, UA: NEGATIVE
Nitrite, UA: NEGATIVE
Protein, UA: NEGATIVE
Spec Grav, UA: 1.01
Urobilinogen, UA: NEGATIVE
pH, UA: 7

## 2013-05-22 NOTE — Patient Instructions (Addendum)
Continue diet and exercise for hyperlipidemia. Continue losartan for hypertension. We will make colonoscopy appointment for you. Return in one year or as needed.

## 2013-05-22 NOTE — Progress Notes (Signed)
Subjective:    Patient ID: Anna Sanchez, female    DOB: 12-28-1950, 62 y.o.   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 She has a history of hypertension treated with losartan. History of hyperlipidemia treated by diet. History of insomnia, dyslexia, dependent edema, osteopenia. No recent colonoscopy.  Patient has noticed some yellowing of right thumbnail. Thinks it might be a fungus. Suggested she see dermatologist about this.  History of left plantar fasciitis. History of total abdominal hysterectomy and BSO around 1990. Takes multivitamin, fish old, B complex, ibuprofen and aspirin 81 mg daily. She does take probiotics.  Says she has a history of dyslexia.  Was given prescription to have Zostavax vaccine done at pharmacy.  Says she had flexible sigmoidoscopy done at [facility] in [place] prior to moving to [place] in the 1980s. I do not have a copy of that report. History of irritable bowel symptoms. Eats lots of fresh fruits and vegetables.  Social history: She is single has a 4 year college degree. She works as a Agricultural engineer. She quit smoking around 2004 and formerly smoked a half to one pack per day for 33 years. Social alcohol consumption on occasion. She gets her exercise by working in yards. She is a native of [place]. She moved from [place] to [place] in the 1970s. They've to [place] in the 1980s.  Family history: Father with history of diabetes and died of cirrhosis of liver at age 10. Mother with history of nervous breakdown, suicide, and headaches. Sister with history of suicide in her 9s. 2 sisters living one of which had a brain aneurysm and history of adult onset diabetes mellitus. One brother died in a motorcycle accident at age 37. One brother with history of hepatitis C. One brother with history of coronary artery disease and alcoholism. One brother with history of prostate  cancer. 3 brothers living. 2 sisters living.    Review of Systems  Constitutional: Negative.   HENT: Negative.   Eyes: Negative.   Respiratory: Negative.   Cardiovascular: Negative.   Gastrointestinal:       History of irritable bowel symptoms  Endocrine: Negative.   Genitourinary: Negative.   Neurological:       History of dyslexia  Psychiatric/Behavioral:       History of insomnia for which she takes melatonin       Objective:   Physical Exam  Vitals reviewed. Constitutional: She is oriented to person, place, and time. She appears well-developed and well-nourished. No distress.  HENT:  Head: Normocephalic and atraumatic.  Right Ear: External ear normal.  Left Ear: External ear normal.  Mouth/Throat: Oropharynx is clear and moist. No oropharyngeal exudate.  Eyes: Conjunctivae and EOM are normal. Pupils are equal, round, and reactive to light. Right eye exhibits no discharge. Left eye exhibits no discharge. No scleral icterus.  Neck: Neck supple. No JVD present. No thyromegaly present.  Cardiovascular: Normal rate, regular rhythm, normal heart sounds and intact distal pulses.   No murmur heard. Pulmonary/Chest: Effort normal and breath sounds normal. No respiratory distress. She has no wheezes. She has no rales. She exhibits no tenderness.  Abdominal: Bowel sounds are normal. She exhibits no distension and no mass. There is no tenderness. There is no rebound and no guarding.  Genitourinary:  Deferred. She is status post total Donald hysterectomy BSO  Musculoskeletal: She exhibits no edema.  Lymphadenopathy:    She has no cervical  adenopathy.  Neurological: She is alert and oriented to person, place, and time. She has normal reflexes. No cranial nerve deficit. Coordination normal.  Skin: Skin is warm and dry. No rash noted. She is not diaphoretic.  Psychiatric: She has a normal mood and affect. Her behavior is normal. Judgment and thought content normal.           Assessment & Plan:  Hypertension-controlled on Cozaar 100 mg daily  Insomnia-takes melatonin  History of dyslexia  History of osteopenia treated with calcium and vitamin D  Plan: Patient does not want to be on statin therapy. Prefers to control lipids with diet and exercise. She will be seen on a yearly basis. Has refused colonoscopy in the past. To consider Zostavax immunization. Says she will consider colonoscopy.

## 2013-05-26 ENCOUNTER — Telehealth: Payer: Self-pay

## 2013-05-26 DIAGNOSIS — Z1211 Encounter for screening for malignant neoplasm of colon: Secondary | ICD-10-CM

## 2013-05-26 NOTE — Telephone Encounter (Signed)
Screening colon ordered in Epic. Patient wants to see Dr. Juanda Chance

## 2013-06-25 ENCOUNTER — Encounter: Payer: Self-pay | Admitting: Gastroenterology

## 2013-08-06 ENCOUNTER — Ambulatory Visit (AMBULATORY_SURGERY_CENTER): Payer: BC Managed Care – PPO

## 2013-08-06 VITALS — Ht 67.0 in | Wt 200.0 lb

## 2013-08-06 DIAGNOSIS — Z1211 Encounter for screening for malignant neoplasm of colon: Secondary | ICD-10-CM

## 2013-08-06 MED ORDER — SUPREP BOWEL PREP KIT 17.5-3.13-1.6 GM/177ML PO SOLN: 1.0000 | Freq: Once | ORAL | Status: DC

## 2013-08-09 ENCOUNTER — Encounter: Payer: Self-pay | Admitting: Gastroenterology

## 2013-09-02 ENCOUNTER — Ambulatory Visit (AMBULATORY_SURGERY_CENTER): Payer: BC Managed Care – PPO | Admitting: Gastroenterology

## 2013-09-02 ENCOUNTER — Encounter: Payer: Self-pay | Admitting: Gastroenterology

## 2013-09-02 VITALS — BP 138/68 | HR 66 | Temp 97.0°F | Resp 35 | Ht 67.0 in | Wt 200.0 lb

## 2013-09-02 DIAGNOSIS — D126 Benign neoplasm of colon, unspecified: Secondary | ICD-10-CM

## 2013-09-02 DIAGNOSIS — Z1211 Encounter for screening for malignant neoplasm of colon: Secondary | ICD-10-CM

## 2013-09-02 DIAGNOSIS — K5730 Diverticulosis of large intestine without perforation or abscess without bleeding: Secondary | ICD-10-CM

## 2013-09-02 MED ORDER — SODIUM CHLORIDE 0.9 % IV SOLN: 500.0000 mL | INTRAVENOUS | Status: DC

## 2013-09-02 NOTE — Patient Instructions (Signed)
YOU HAD AN ENDOSCOPIC PROCEDURE TODAY AT THE [facility]: Refer to the procedure report that was given to you for any specific questions about what was found during the examination.  If the procedure report does not answer your questions, please call your gastroenterologist to clarify.  If you requested that your care partner not be given the details of your procedure findings, then the procedure report has been included in a sealed envelope for you to review at your convenience later.  YOU SHOULD EXPECT: Some feelings of bloating in the abdomen. Passage of more gas than usual.  Walking can help get rid of the air that was put into your GI tract during the procedure and reduce the bloating. If you had a lower endoscopy (such as a colonoscopy or flexible sigmoidoscopy) you may notice spotting of blood in your stool or on the toilet paper. If you underwent a bowel prep for your procedure, then you may not have a normal bowel movement for a few days.  DIET: Your first meal following the procedure should be a light meal and then it is ok to progress to your normal diet.  A half-sandwich or bowl of soup is an example of a good first meal.  Heavy or fried foods are harder to digest and may make you feel nauseous or bloated.  Likewise meals heavy in dairy and vegetables can cause extra gas to form and this can also increase the bloating.  Drink plenty of fluids but you should avoid alcoholic beverages for 24 hours.  ACTIVITY: Your care partner should take you home directly after the procedure.  You should plan to take it easy, moving slowly for the rest of the day.  You can resume normal activity the day after the procedure however you should NOT DRIVE or use heavy machinery for 24 hours (because of the sedation medicines used during the test).    SYMPTOMS TO REPORT IMMEDIATELY: A gastroenterologist can be reached at any hour.  During normal business hours, 8:30 AM to 5:00 PM Monday through Friday,  call [phone].  After hours and on weekends, please call the GI answering service at [phone] who will take a message and have the physician on call contact you.   Following lower endoscopy (colonoscopy or flexible sigmoidoscopy):  Excessive amounts of blood in the stool  Significant tenderness or worsening of abdominal pains  Swelling of the abdomen that is new, acute  Fever of 100F or higher    FOLLOW UP: If any biopsies were taken you will be contacted by phone or by letter within the next 1-3 weeks.  Call your gastroenterologist if you have not heard about the biopsies in 3 weeks.  Our staff will call the home number listed on your records the next business day following your procedure to check on you and address any questions or concerns that you may have at that time regarding the information given to you following your procedure. This is a courtesy call and so if there is no answer at the home number and we have not heard from you through the emergency physician on call, we will assume that you have returned to your regular daily activities without incident.  SIGNATURES/CONFIDENTIALITY: You and/or your care partner have signed paperwork which will be entered into your electronic medical record.  These signatures attest to the fact that that the information above on your After Visit Summary has been reviewed and is understood.  Full responsibility of the confidentiality   of this discharge information lies with you and/or your care-partner.  Handouts on polyps, diverticulosis, high fiber diet 

## 2013-09-02 NOTE — Progress Notes (Signed)
   Endoscopy Center Anesthesia Post-op Note  Patient: Anna Sanchez  Procedure(s) Performed: colonoscopy  Patient Location: [facility] - Recovery Area  Anesthesia Type: Deep Sedation/Propofol  Level of Consciousness: awake, oriented and patient cooperative  Airway and Oxygen Therapy: Patient Spontanous Breathing  Post-op Pain: none  Post-op Assessment:  Post-op Vital signs reviewed, Patient's Cardiovascular Status Stable, Respiratory Function Stable, Patent Airway, No signs of Nausea or vomiting and Pain level controlled  Post-op Vital Signs: Reviewed and stable  Complications: No apparent anesthesia complications  Landis Dowdy E 12:10 PM

## 2013-09-02 NOTE — Progress Notes (Signed)
Called to room to assist during endoscopic procedure.  Patient ID and intended procedure confirmed with present staff. Received instructions for my participation in the procedure from the performing physician.  

## 2013-09-02 NOTE — Progress Notes (Signed)
Patient did not experience any of the following events: a burn prior to discharge; a fall within the facility; wrong site/side/patient/procedure/implant event; or a hospital transfer or hospital admission upon discharge from the facility. (G8907) Patient did not have preoperative order for IV antibiotic SSI prophylaxis. (G8918)  

## 2013-09-02 NOTE — Op Note (Signed)
[facility] [address] [place], [address]   COLONOSCOPY PROCEDURE REPORT  PATIENT: Anna Sanchez, Anna Sanchez  MR#: [id number] BIRTHDATE: July 11, 1951 , 62  yrs. old GENDER: Female ENDOSCOPIST: Louis Meckel, MD REFERRED XB:JYNW Waymond Cera, M.D. PROCEDURE DATE:  09/02/2013 PROCEDURE:   Colonoscopy with snare polypectomy First Screening Colonoscopy - Avg.  risk and is 50 yrs.  old or older Yes.  Prior Negative Screening - Now for repeat screening. N/A  History of Adenoma - Now for follow-up colonoscopy & has been > or = to 3 yrs.  N/A  Polyps Removed Today? Yes. ASA CLASS:   Class II INDICATIONS:average risk screening. MEDICATIONS: MAC sedation, administered by CRNA and propofol (Diprivan) 300mg  IV  DESCRIPTION OF PROCEDURE:   After the risks benefits and alternatives of the procedure were thoroughly explained, informed consent was obtained.  A digital rectal exam revealed no abnormalities of the rectum.   The LB [id number]  endoscope was introduced through the anus and advanced to the cecum, which was identified by both the appendix and ileocecal valve. No adverse events experienced.   The quality of the prep was excellent using Suprep  The instrument was then slowly withdrawn as the colon was fully examined.      COLON FINDINGS: A sessile polyp measuring 3 mm in size was found in the ascending colon (no photo available)  A polypectomy was performed with a cold snare.  The resection was complete and the polyp tissue was completely retrieved.   Mild diverticulosis was noted in the sigmoid colon.   The colon was otherwise normal. There was no diverticulosis, inflammation, polyps or cancers unless previously stated.  Retroflexed views revealed no abnormalities. The time to cecum=5 minutes 12 seconds.  Withdrawal time=8 minutes 44 seconds.  The scope was withdrawn and the procedure completed. COMPLICATIONS: There were no complications.  ENDOSCOPIC  IMPRESSION: 1.   Sessile polyp measuring 3 mm in size was found in the ascending colon; polypectomy was performed with a cold snare 2.   Mild diverticulosis was noted in the sigmoid colon 3.   The colon was otherwise normal  RECOMMENDATIONS: If the polyp(s) removed today are proven to be adenomatous (pre-cancerous) polyps, you will need a repeat colonoscopy in 5 years.  Otherwise you should continue to follow colorectal cancer screening guidelines for "routine risk" patients with colonoscopy in 10 years.  You will receive a letter within 1-2 weeks with the results of your biopsy as well as final recommendations.  Please call my office if you have not received a letter after 3 weeks.   eSigned:  Louis Meckel, MD 09/02/2013 12:01 PM   cc:   PATIENT NAME:  [name], [name] MR#: [id number]

## 2013-09-03 ENCOUNTER — Telehealth: Payer: Self-pay | Admitting: *Deleted

## 2013-09-03 NOTE — Telephone Encounter (Signed)
Message left

## 2013-09-08 ENCOUNTER — Encounter: Payer: Self-pay | Admitting: Gastroenterology

## 2014-07-05 ENCOUNTER — Other Ambulatory Visit: Payer: Self-pay

## 2014-07-05 DIAGNOSIS — Z1239 Encounter for other screening for malignant neoplasm of breast: Secondary | ICD-10-CM

## 2014-07-26 ENCOUNTER — Ambulatory Visit
Admission: RE | Admit: 2014-07-26 | Discharge: 2014-07-26 | Disposition: A | Discharge status: Home / Self Care | Payer: 59 | Source: Ambulatory Visit

## 2014-07-26 ENCOUNTER — Other Ambulatory Visit: Payer: Self-pay

## 2014-07-26 DIAGNOSIS — Z1231 Encounter for screening mammogram for malignant neoplasm of breast: Secondary | ICD-10-CM

## 2015-04-07 ENCOUNTER — Other Ambulatory Visit: Payer: Commercial Managed Care - HMO | Admitting: Internal Medicine

## 2015-04-07 DIAGNOSIS — E559 Vitamin D deficiency, unspecified: Secondary | ICD-10-CM | POA: Diagnosis not present

## 2015-04-07 DIAGNOSIS — E785 Hyperlipidemia, unspecified: Secondary | ICD-10-CM

## 2015-04-07 DIAGNOSIS — Z79899 Other long term (current) drug therapy: Secondary | ICD-10-CM

## 2015-04-07 DIAGNOSIS — R5383 Other fatigue: Secondary | ICD-10-CM | POA: Diagnosis not present

## 2015-04-07 LAB — COMPLETE METABOLIC PANEL WITH GFR
ALT: 25 U/L (ref 0–35)
AST: 27 U/L (ref 0–37)
Albumin: 4.2 g/dL (ref 3.5–5.2)
Alkaline Phosphatase: 63 U/L (ref 39–117)
BILIRUBIN TOTAL: 0.6 mg/dL (ref 0.2–1.2)
BUN: 16 mg/dL (ref 6–23)
CO2: 26 mEq/L (ref 19–32)
Calcium: 9.2 mg/dL (ref 8.4–10.5)
Chloride: 104 mEq/L (ref 96–112)
Creat: 0.7 mg/dL (ref 0.50–1.10)
GFR, Est African American: >89 mL/min
GFR, Est Non African American: >89 mL/min
Glucose, Bld: 97 mg/dL (ref 70–99)
Potassium: 4.4 mEq/L (ref 3.5–5.3)
Sodium: 139 mEq/L (ref 135–145)
TOTAL PROTEIN: 6.4 g/dL (ref 6.0–8.3)

## 2015-04-07 LAB — CBC WITH DIFFERENTIAL/PLATELET
BASOS ABS: 0.1 10*3/uL (ref 0.0–0.1)
BASOS PCT: 2 % — AB (ref 0–1)
Eosinophils Absolute: 0.2 10*3/uL (ref 0.0–0.7)
Eosinophils Relative: 3 % (ref 0–5)
HCT: 42.9 % (ref 36.0–46.0)
Hemoglobin: 14.3 g/dL (ref 12.0–15.0)
Lymphocytes Relative: 26 % (ref 12–46)
Lymphs Abs: 1.3 10*3/uL (ref 0.7–4.0)
MCH: 31.4 pg (ref 26.0–34.0)
MCHC: 33.3 g/dL (ref 30.0–36.0)
MCV: 94.1 fL (ref 78.0–100.0)
MPV: 11.6 fL (ref 8.6–12.4)
Monocytes Absolute: 0.4 10*3/uL (ref 0.1–1.0)
Monocytes Relative: 8 % (ref 3–12)
NEUTROS ABS: 3.1 10*3/uL (ref 1.7–7.7)
NEUTROS PCT: 61 % (ref 43–77)
Platelets: 188 10*3/uL (ref 150–400)
RBC: 4.56 MIL/uL (ref 3.87–5.11)
RDW: 13.2 % (ref 11.5–15.5)
WBC: 5.1 10*3/uL (ref 4.0–10.5)

## 2015-04-07 LAB — LIPID PANEL
Cholesterol: 210 mg/dL — ABNORMAL HIGH (ref 0–200)
HDL: 60 mg/dL (ref >=46)
LDL Cholesterol: 134 mg/dL — ABNORMAL HIGH (ref 0–99)
TRIGLYCERIDES: 80 mg/dL (ref <150)
Total CHOL/HDL Ratio: 3.5 Ratio
VLDL: 16 mg/dL (ref 0–40)

## 2015-04-07 LAB — TSH: TSH: 3.201 u[IU]/mL (ref 0.350–4.500)

## 2015-04-08 LAB — VITAMIN D 25 HYDROXY (VIT D DEFICIENCY, FRACTURES): VIT D 25 HYDROXY: 30 ng/mL (ref 30–100)

## 2015-04-10 ENCOUNTER — Encounter: Payer: Self-pay | Admitting: Internal Medicine

## 2015-04-10 ENCOUNTER — Ambulatory Visit (INDEPENDENT_AMBULATORY_CARE_PROVIDER_SITE_OTHER): Payer: Commercial Managed Care - HMO | Admitting: Internal Medicine

## 2015-04-10 VITALS — BP 126/80 | HR 78 | Temp 98.5°F | Ht 67.5 in | Wt 212.0 lb

## 2015-04-10 DIAGNOSIS — Z0000 Encounter for general adult medical examination without abnormal findings: Secondary | ICD-10-CM | POA: Diagnosis not present

## 2015-04-10 DIAGNOSIS — L719 Rosacea, unspecified: Secondary | ICD-10-CM | POA: Diagnosis not present

## 2015-04-10 LAB — POCT URINALYSIS DIPSTICK
BILIRUBIN UA: NEGATIVE
Blood, UA: NEGATIVE
GLUCOSE UA: NEGATIVE
Ketones, UA: NEGATIVE
LEUKOCYTES UA: NEGATIVE
NITRITE UA: NEGATIVE
PH UA: 5
Protein, UA: NEGATIVE
Spec Grav, UA: <=1.005
Urobilinogen, UA: NEGATIVE

## 2015-04-10 MED ORDER — METRONIDAZOLE 1 % EX GEL: Freq: Every day | CUTANEOUS | Status: DC

## 2015-04-13 ENCOUNTER — Ambulatory Visit (INDEPENDENT_AMBULATORY_CARE_PROVIDER_SITE_OTHER): Payer: Commercial Managed Care - HMO | Admitting: Internal Medicine

## 2015-04-13 DIAGNOSIS — Z23 Encounter for immunization: Secondary | ICD-10-CM | POA: Diagnosis not present

## 2015-04-22 NOTE — Progress Notes (Signed)
Subjective:    Patient ID: Anna Sanchez, female    DOB: [birth year], 64 y.o.   MRN: [id number]  HPI  64 year old White female in today for Welcome to Medicare physical examination. She has a history of hypertension treated with losartan. History of hyperlipidemia treated with diet. History of insomnia, dyslexia, dependent edema, osteopenia.  Past medical history: Left plantar fascitis in the past. History of total abdominal hysterectomy BSO around 1990. Takes multivitamin, fish oil, B complex, ibuprofen, aspirin 81 mg daily.  Says she had flexible sigmoidoscopy done at [facility] prior to moving to [place] in the 1980s. I do not have a copy of that report.  History of irritable bowel symptoms.  Social history: She is single and has a 4 year college degree. She works as a Scientist, clinical (histocompatibility and immunogenetics). She quit smoking around 2004. Formerly smoked a half to one pack of cigarettes daily for 33 years. Social alcohol consumption on occasion. She gets her exercise by working in yards. She is a native of [place]. She moved from [place] to [place] in the 1970s. Came to [place] in the 1980s.  Family history: Father with history of diabetes and died of cirrhosis of the liver at age 21. Mother with history of nervous breakdown, suicide, headaches. Sister with history of suicide in her 36s. 2 sisters living one of which had a brain aneurysm and history of adult onset diabetes mellitus. One brother with history of hepatitis C. One brother died in a motorcycle accident at age 65. One brother with history of coronary artery disease and alcoholism. One brother with history of prostate cancer.    Review of Systems  Constitutional: Negative.   All other systems reviewed and are negative.      Objective:   Physical Exam  Constitutional: She is oriented to person, place, and time. She appears well-developed and well-nourished. No distress.  HENT:  Head: Normocephalic and atraumatic.  Right Ear:  External ear normal.  Mouth/Throat: Oropharynx is clear and moist. No oropharyngeal exudate.  Eyes: Conjunctivae and EOM are normal. Pupils are equal, round, and reactive to light. Right eye exhibits no discharge. Left eye exhibits no discharge. No scleral icterus.  Neck: Neck supple. No JVD present. No thyromegaly present.  Cardiovascular: Normal rate, regular rhythm and normal heart sounds.   No murmur heard. Pulmonary/Chest: Effort normal and breath sounds normal. No respiratory distress. She has no wheezes. She has no rales.  Breasts normal female without masses  Abdominal: Bowel sounds are normal. She exhibits no distension and no mass. There is no tenderness. There is no rebound and no guarding.  Genitourinary:  Deferred status post total abdominal hysterectomy BSO  Musculoskeletal: Normal range of motion. She exhibits no edema.  Lymphadenopathy:    She has no cervical adenopathy.  Neurological: She is alert and oriented to person, place, and time. She has normal reflexes. No cranial nerve deficit. Coordination normal.  Skin: Skin is warm and dry. No rash noted. She is not diaphoretic.  Psychiatric: She has a normal mood and affect. Her behavior is normal. Judgment and thought content normal.  Vitals reviewed.         Assessment & Plan:  Normal health maintenance exam  Hypertension-under good control on losartan 100 mg daily  History of insomnia  History of dyslexia  History of osteopenia treated with calcium and vitamin D  Hyperlipidemia-does not want to be on statin therapy  Plan: Return in one year or as needed  Subjective:   Patient  presents for Medicare Annual/Subsequent preventive examination.  Review Past Medical/Family/Social: See above   Risk Factors  Current exercise habits: Works as a Scientist, clinical (histocompatibility and immunogenetics) Dietary issues discussed: Low fat low carbohydrate  Cardiac risk factors: Hypertension hyperlipidemia  Depression Screen  (Note: if answer to either of the  following is "Yes", a more complete depression screening is indicated)   Over the past two weeks, have you felt down, depressed or hopeless? No  Over the past two weeks, have you felt little interest or pleasure in doing things? No Have you lost interest or pleasure in daily life? No Do you often feel hopeless? No Do you cry easily over simple problems? No   Activities of Daily Living  In your present state of health, do you have any difficulty performing the following activities?:   Driving? No  Managing money? No  Feeding yourself? No  Getting from bed to chair? No  Climbing a flight of stairs? No  Preparing food and eating?: No  Bathing or showering? No  Getting dressed: No  Getting to the toilet? No  Using the toilet:No  Moving around from place to place: No  In the past year have you fallen or had a near fall?:No  Are you sexually active? No  Do you have more than one partner? No   Hearing Difficulties: Sometimes Do you often ask people to speak up or repeat themselves? Yes  Do you experience ringing or noises in your ears? Yes but this is not knee Do you have difficulty understanding soft or whispered voices? Sometimes Do you feel that you have a problem with memory? No Do you often misplace items? Sometimes   Home Safety:  Do you have a smoke alarm at your residence? Yes Do you have grab bars in the bathroom? No Do you have throw rugs in your house? Yes   Cognitive Testing  Alert? Yes Normal Appearance?Yes  Oriented to person? Yes Place? Yes  Time? Yes  Recall of three objects? Yes  Can perform simple calculations? Yes  Displays appropriate judgment?Yes  Can read the correct time from a watch face?Yes   List the Names of Other Physician/Practitioners you currently use:  See referral list for the physicians patient is currently seeing.     Review of Systems: See above   Objective:     General appearance: Appears stated age Head: Normocephalic,  without obvious abnormality, atraumatic  Eyes: conj clear, EOMi PEERLA  Ears: normal TM's and external ear canals both ears  Nose: Nares normal. Septum midline. Mucosa normal. No drainage or sinus tenderness.  Throat: lips, mucosa, and tongue normal; teeth and gums normal  Neck: no adenopathy, no carotid bruit, no JVD, supple, symmetrical, trachea midline and thyroid not enlarged, symmetric, no tenderness/mass/nodules  No CVA tenderness.  Lungs: clear to auscultation bilaterally  Breasts: normal appearance, no masses or tenderness Heart: regular rate and rhythm, S1, S2 normal, no murmur, click, rub or gallop  Abdomen: soft, non-tender; bowel sounds normal; no masses, no organomegaly  Musculoskeletal: ROM normal in all joints, no crepitus, no deformity, Normal muscle strengthen. Back  is symmetric, no curvature. Skin: Skin color, texture, turgor normal. No rashes or lesions  Lymph nodes: Cervical, supraclavicular, and axillary nodes normal.  Neurologic: CN 2 -12 Normal, Normal symmetric reflexes. Normal coordination and gait  Psych: Alert & Oriented x 3, Mood appear stable.    Assessment:    Annual wellness medicare exam   Plan:    During the course of the visit  the patient was educated and counseled about appropriate screening and preventive services including:   Annual mammogram     Patient Instructions (the written plan) was given to the patient.  Medicare Attestation  I have personally reviewed:  The patient's medical and social history  Their use of alcohol, tobacco or illicit drugs  Their current medications and supplements  The patient's functional ability including ADLs,fall risks, home safety risks, cognitive, and hearing and visual impairment  Diet and physical activities  Evidence for depression or mood disorders  The patient's weight, height, BMI, and visual acuity have been recorded in the chart. I have made referrals, counseling, and provided education to the  patient based on review of the above and I have provided the patient with a written personalized care plan for preventive services.

## 2015-04-22 NOTE — Patient Instructions (Signed)
Continue to monitor blood pressure at home and continue same medications. Return in one year or as needed. Watch diet and exercise. It was a pleasure to see you today.

## 2016-02-21 ENCOUNTER — Other Ambulatory Visit: Payer: Self-pay | Admitting: Internal Medicine

## 2016-02-21 DIAGNOSIS — Z1231 Encounter for screening mammogram for malignant neoplasm of breast: Secondary | ICD-10-CM

## 2016-03-05 ENCOUNTER — Ambulatory Visit
Admission: RE | Admit: 2016-03-05 | Discharge: 2016-03-05 | Disposition: A | Discharge status: Home / Self Care | Payer: Commercial Managed Care - HMO | Source: Ambulatory Visit | Attending: Internal Medicine | Admitting: Internal Medicine

## 2016-03-05 DIAGNOSIS — Z1231 Encounter for screening mammogram for malignant neoplasm of breast: Secondary | ICD-10-CM | POA: Diagnosis not present

## 2016-04-16 ENCOUNTER — Other Ambulatory Visit: Payer: Commercial Managed Care - HMO | Admitting: Internal Medicine

## 2016-04-16 DIAGNOSIS — Z0000 Encounter for general adult medical examination without abnormal findings: Secondary | ICD-10-CM

## 2016-04-16 DIAGNOSIS — Z13220 Encounter for screening for lipoid disorders: Secondary | ICD-10-CM

## 2016-04-16 DIAGNOSIS — Z1321 Encounter for screening for nutritional disorder: Secondary | ICD-10-CM

## 2016-04-16 DIAGNOSIS — Z130 Encounter for screening for diseases of the blood and blood-forming organs and certain disorders involving the immune mechanism: Secondary | ICD-10-CM

## 2016-04-16 DIAGNOSIS — Z1329 Encounter for screening for other suspected endocrine disorder: Secondary | ICD-10-CM | POA: Diagnosis not present

## 2016-04-16 LAB — CBC WITH DIFFERENTIAL/PLATELET
Basophils Absolute: 70 cells/uL (ref 0–200)
Basophils Relative: 1 %
Eosinophils Absolute: 140 cells/uL (ref 15–500)
Eosinophils Relative: 2 %
HEMATOCRIT: 42.9 % (ref 35.0–45.0)
Hemoglobin: 14.2 g/dL (ref 11.7–15.5)
LYMPHS PCT: 20 %
Lymphs Abs: 1400 cells/uL (ref 850–3900)
MCH: 31.2 pg (ref 27.0–33.0)
MCHC: 33.1 g/dL (ref 32.0–36.0)
MCV: 94.3 fL (ref 80.0–100.0)
MPV: 11.9 fL (ref 7.5–12.5)
Monocytes Absolute: 490 cells/uL (ref 200–950)
Monocytes Relative: 7 %
Neutro Abs: 4900 cells/uL (ref 1500–7800)
Neutrophils Relative %: 70 %
PLATELETS: 197 10*3/uL (ref 140–400)
RBC: 4.55 MIL/uL (ref 3.80–5.10)
RDW: 13.4 % (ref 11.0–15.0)
WBC: 7 10*3/uL (ref 3.8–10.8)

## 2016-04-16 LAB — COMPLETE METABOLIC PANEL WITH GFR
ALT: 24 U/L (ref 6–29)
AST: 24 U/L (ref 10–35)
Albumin: 4.4 g/dL (ref 3.6–5.1)
Alkaline Phosphatase: 79 U/L (ref 33–130)
BUN: 16 mg/dL (ref 7–25)
CALCIUM: 9.3 mg/dL (ref 8.6–10.4)
CHLORIDE: 104 mmol/L (ref 98–110)
CO2: 26 mmol/L (ref 20–31)
Creat: 0.79 mg/dL (ref 0.50–0.99)
GFR, Est African American: >89 mL/min (ref >=60)
GFR, Est Non African American: 78 mL/min (ref >=60)
Glucose, Bld: 94 mg/dL (ref 65–99)
POTASSIUM: 4.7 mmol/L (ref 3.5–5.3)
Sodium: 138 mmol/L (ref 135–146)
Total Bilirubin: 0.5 mg/dL (ref 0.2–1.2)
Total Protein: 6.6 g/dL (ref 6.1–8.1)

## 2016-04-16 LAB — LIPID PANEL
CHOL/HDL RATIO: 3.3 ratio (ref <=5.0)
CHOLESTEROL: 203 mg/dL — AB (ref 125–200)
HDL: 61 mg/dL (ref >=46)
LDL CALC: 115 mg/dL (ref <130)
Triglycerides: 133 mg/dL (ref <150)
VLDL: 27 mg/dL (ref <30)

## 2016-04-16 LAB — TSH: TSH: 4.35 mIU/L

## 2016-04-17 LAB — VITAMIN D 25 HYDROXY (VIT D DEFICIENCY, FRACTURES): VIT D 25 HYDROXY: 34 ng/mL (ref 30–100)

## 2016-04-18 ENCOUNTER — Encounter: Payer: Self-pay | Admitting: Internal Medicine

## 2016-04-18 ENCOUNTER — Ambulatory Visit (INDEPENDENT_AMBULATORY_CARE_PROVIDER_SITE_OTHER): Payer: Commercial Managed Care - HMO | Admitting: Internal Medicine

## 2016-04-18 VITALS — BP 132/78 | HR 72 | Temp 97.4°F | Ht 67.5 in | Wt 210.0 lb

## 2016-04-18 DIAGNOSIS — M5441 Lumbago with sciatica, right side: Secondary | ICD-10-CM | POA: Diagnosis not present

## 2016-04-18 DIAGNOSIS — I10 Essential (primary) hypertension: Secondary | ICD-10-CM | POA: Diagnosis not present

## 2016-04-18 DIAGNOSIS — Z0000 Encounter for general adult medical examination without abnormal findings: Secondary | ICD-10-CM | POA: Diagnosis not present

## 2016-04-18 DIAGNOSIS — E039 Hypothyroidism, unspecified: Secondary | ICD-10-CM

## 2016-04-18 LAB — POCT URINALYSIS DIPSTICK
Bilirubin, UA: NEGATIVE
Blood, UA: NEGATIVE
GLUCOSE UA: NEGATIVE
Ketones, UA: NEGATIVE
LEUKOCYTES UA: NEGATIVE
NITRITE UA: NEGATIVE
Protein, UA: NEGATIVE
Spec Grav, UA: <=1.005
Urobilinogen, UA: 0.2
pH, UA: 6.5

## 2016-04-18 MED ORDER — LEVOTHYROXINE SODIUM 50 MCG PO TABS: ORAL_TABLET | ORAL | 0 refills | Status: DC

## 2016-04-18 NOTE — Patient Instructions (Signed)
Please call if back pain does not improve. Continue medication for hypertension. Continue to work on diet exercise and weight loss efforts. Begin low-dose levothyroxine 0.05 mg daily on an empty stomach and return in 6 weeks.

## 2016-04-18 NOTE — Progress Notes (Signed)
Subjective:    Patient ID: Anna Sanchez, female    DOB: 25-Nov-1950, 65 y.o.   MRN: [id number]  HPI 65 year old White Female in today for health maintenance exam and evaluation of medical issues. Has been having some issues with low back pain bilaterally nature but extending a bit into her right buttock and some numbness in her right lateral leg. She received acupuncture treatment recently but a couple days later had a flareup of worse back pain. She's getting a massage today.  She has a history of hyperlipidemia treated with diet. History of insomnia, dyslexia, dependent edema and osteopenia. History of hypertension treated with losartan.  All in all, her general health is excellent. Her TSH is slightly elevated at 4.35. Last year it was 3.201. We're going start her on low-dose thyroid replacement and follow-up in 6 weeks.  Lipid panel is normal. C met and CBC with differential normal. Urinalysis is normal.    Past medical history: Left plantar fasciitis in the past. History of total abdominal hysterectomy and BSO around 1990. She takes multivitamin, Fischel will, B complex, tumeric, ibuprofen, aspirin 81 mg daily.  History of irritable bowel symptoms at times.  Says she had a flexible sigmoidoscopy done at [facility] in [place] prior to moving to [place]. I do not have a copy of that report.  Reminded about annual mammogram.  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 She quit smoking around 2004. Formerly smoked a half to one pack of cigarettes daily for 33 years. Social alcohol consumption on occasion. She gets exercise by working in yards. She is a native of [place]. She moved from [place] to [place] in the 1970s. Came to [place] in the 1980s.  Family history: Father with history of diabetes and died of cirrhosis of the liver at age 23. Mother with history of nurse breakdown, suicide, headaches. Sister with  history of suicide in her 32s. 2 sisters living one of which is had a brain aneurysm and has history of adult onset diabetes mellitus. One brother with history of hepatitis C. One brother died in a motorcycle accident at age 61. One brother with history of coronary artery disease and alcoholism. One brother with history of prostate cancer.      Review of Systems low back pain as above. Otherwise negative.     Objective:   Physical Exam  Constitutional: She is oriented to person, place, and time. She appears well-developed and well-nourished. No distress.  HENT:  Head: Normocephalic and atraumatic.  Right Ear: External ear normal.  Left Ear: External ear normal.  Mouth/Throat: Oropharynx is clear and moist. No oropharyngeal exudate.  Eyes: Conjunctivae and EOM are normal. Pupils are equal, round, and reactive to light. Right eye exhibits no discharge. Left eye exhibits no discharge. No scleral icterus.  Neck: Neck supple. No JVD present. No thyromegaly present.  Cardiovascular: Normal rate, regular rhythm, normal heart sounds and intact distal pulses.   No murmur heard. Pulmonary/Chest: Effort normal and breath sounds normal. No respiratory distress. She has no wheezes. She has no rales.  Breasts normal female without masses  Abdominal: Soft. Bowel sounds are normal. She exhibits no distension and no mass. There is no tenderness. There is no rebound and no guarding.  Genitourinary:  Genitourinary Comments: Deferred status post TAH/BSO  Musculoskeletal: She exhibits no edema.  Slight bilateral back pain with straight leg raising bilaterally  Lymphadenopathy:    She has  no cervical adenopathy.  Neurological: She is alert and oriented to person, place, and time. She has normal reflexes. Coordination normal.  Skin: Skin is warm and dry. No rash noted. She is not diaphoretic.  Psychiatric: She has a normal mood and affect. Her behavior is normal. Judgment and thought content normal.    Vitals reviewed.         Assessment & Plan:  Low back pain with right-sided sciatica  History of plantar fasciitis treated with inserts  Essential hypertension-stable on current regimen  Hyperlipidemia- treated with diet. Lipid panel is entirely within normal limits with the exception of total cholesterol of 203 which is very good.  History of insomnia-thinks taking tumeric helps  Dyslexia  Osteopenia    New Onset hypothyroidism-start Synthroid 0.05 mg daily and follow-up in 6 weeks.   Subjective:   Patient presents for Medicare Annual/Subsequent preventive examination.  Review Past Medical/Family/Social:See above   Risk Factors  Current exercise habits: A lot of exercise with her work Dietary issues discussed: Low fat low carb  Cardiac risk factors: HTN  Depression Screen  (Note: if answer to either of the following is "Yes", a more complete depression screening is indicated)   Over the past two weeks, have you felt down, depressed or hopeless? No  Over the past two weeks, have you felt little interest or pleasure in doing things? No Have you lost interest or pleasure in daily life? No Do you often feel hopeless? No Do you cry easily over simple problems? No   Activities of Daily Living  In your present state of health, do you have any difficulty performing the following activities?:   Driving? No  Managing money? No  Feeding yourself? No  Getting from bed to chair? No  Climbing a flight of stairs? No  Preparing food and eating?: No  Bathing or showering? No  Getting dressed: No  Getting to the toilet? No  Using the toilet:No  Moving around from place to place: No  In the past year have you fallen or had a near fall?:No  Are you sexually active? No  Do you have more than one partner? No   Hearing Difficulties: No  Do you often ask people to speak up or repeat themselves? yes Do you experience ringing or noises in your ears? sometimes Do you have  difficulty understanding soft or whispered voices? yes Do you feel that you have a problem with memory? No Do you often misplace items? No    Home Safety:  Do you have a smoke alarm at your residence? Yes Do you have grab bars in the bathroom? no Do you have throw rugs in your house? yes   Cognitive Testing  Alert? Yes Normal Appearance?Yes  Oriented to person? Yes Place? Yes  Time? Yes  Recall of three objects? Yes  Can perform simple calculations? Yes  Displays appropriate judgment?Yes  Can read the correct time from a watch face?Yes   List the Names of Other Physician/Practitioners you currently use:  See referral list for the physicians patient is currently seeing.  none   Review of Systems: see above   Objective:     General appearance: Appears stated age and mildly obese  Head: Normocephalic, without obvious abnormality, atraumatic  Eyes: conj clear, EOMi PEERLA  Ears: normal TM's and external ear canals both ears  Nose: Nares normal. Septum midline. Mucosa normal. No drainage or sinus tenderness.  Throat: lips, mucosa, and tongue normal; teeth and gums normal  Neck:  no adenopathy, no carotid bruit, no JVD, supple, symmetrical, trachea midline and thyroid not enlarged, symmetric, no tenderness/mass/nodules  No CVA tenderness.  Lungs: clear to auscultation bilaterally  Breasts: normal appearance, no masses or tenderness, top of the pacemaker on left upper chest. Incision well-healed. It is tender.  Heart: regular rate and rhythm, S1, S2 normal, no murmur, click, rub or gallop  Abdomen: soft, non-tender; bowel sounds normal; no masses, no organomegaly  Musculoskeletal: ROM normal in all joints, no crepitus, no deformity, Normal muscle strengthen. Back  is symmetric, no curvature. Skin: Skin color, texture, turgor normal. No rashes or lesions  Lymph nodes: Cervical, supraclavicular, and axillary nodes normal.  Neurologic: CN 2 -12 Normal, Normal symmetric reflexes.  Normal coordination and gait  Psych: Alert & Oriented x 3, Mood appear stable.    Assessment:    Annual wellness medicare exam   Plan:    During the course of the visit the patient was educated and counseled about appropriate screening and preventive services including:   Recommend annual mammogram- last June 2017 Pt  Had colonoscopy 2014 Annual Flu vaccine Suggest Prevnar    Patient Instructions (the written plan) was given to the patient.  Medicare Attestation  I have personally reviewed:  The patient's medical and social history  Their use of alcohol, tobacco or illicit drugs  Their current medications and supplements  The patient's functional ability including ADLs,fall risks, home safety risks, cognitive, and hearing and visual impairment  Diet and physical activities  Evidence for depression or mood disorders  The patient's weight, height, BMI, and visual acuity have been recorded in the chart. I have made referrals, counseling, and provided education to the patient based on review of the above and I have provided the patient with a written personalized care plan for preventive services.

## 2016-04-30 ENCOUNTER — Telehealth: Payer: Self-pay | Admitting: Internal Medicine

## 2016-04-30 NOTE — Telephone Encounter (Signed)
Ms. Lanser called saying she had adverse side effects to the medication that was given to her last for her thyroid issues. She said she could literally "feel the medicine leaving her system" after she stopped taking it. She's now wondering if a different medication can be prescribed or if there's something she can purchase at a health food store that may help with her symptoms. She'd like a phone call regarding this.  Pt's ph# [phone] Thank you.

## 2016-05-01 NOTE — Telephone Encounter (Signed)
She is to take med as prescribed never heard of this side effect before. Take on empty stomach with no other meds. Other option is to see Endocrinologist.

## 2016-05-02 NOTE — Telephone Encounter (Signed)
Patient says she tried the instructions Dr. Renold Genta recommended in her previous note, and does not feel that worked,  She d/c med, will be taking natural supplements and will c/b if there is a need. Refused to see Endocrinologist b/c she says she does not know what they can offer to help. Advised I would let Dr. Renold Genta know.

## 2016-06-04 ENCOUNTER — Other Ambulatory Visit: Payer: Commercial Managed Care - HMO | Admitting: Internal Medicine

## 2016-06-07 ENCOUNTER — Ambulatory Visit: Payer: Commercial Managed Care - HMO | Admitting: Internal Medicine

## 2016-08-12 ENCOUNTER — Other Ambulatory Visit: Payer: Self-pay | Admitting: Internal Medicine

## 2017-04-21 ENCOUNTER — Other Ambulatory Visit: Payer: Medicare HMO | Admitting: Internal Medicine

## 2017-04-21 DIAGNOSIS — M8580 Other specified disorders of bone density and structure, unspecified site: Secondary | ICD-10-CM | POA: Diagnosis not present

## 2017-04-21 DIAGNOSIS — Z0000 Encounter for general adult medical examination without abnormal findings: Secondary | ICD-10-CM

## 2017-04-21 DIAGNOSIS — E785 Hyperlipidemia, unspecified: Secondary | ICD-10-CM | POA: Diagnosis not present

## 2017-04-21 DIAGNOSIS — I10 Essential (primary) hypertension: Secondary | ICD-10-CM | POA: Diagnosis not present

## 2017-04-21 DIAGNOSIS — E039 Hypothyroidism, unspecified: Secondary | ICD-10-CM

## 2017-04-21 LAB — CBC WITH DIFFERENTIAL/PLATELET
Basophils Absolute: 56 cells/uL (ref 0–200)
Basophils Relative: 1 %
Eosinophils Absolute: 112 cells/uL (ref 15–500)
Eosinophils Relative: 2 %
HCT: 43.4 % (ref 35.0–45.0)
Hemoglobin: 14.2 g/dL (ref 11.7–15.5)
Lymphocytes Relative: 25 %
Lymphs Abs: 1400 cells/uL (ref 850–3900)
MCH: 31.6 pg (ref 27.0–33.0)
MCHC: 32.7 g/dL (ref 32.0–36.0)
MCV: 96.7 fL (ref 80.0–100.0)
MONOS PCT: 7 %
MPV: 11.6 fL (ref 7.5–12.5)
Monocytes Absolute: 392 cells/uL (ref 200–950)
NEUTROS PCT: 65 %
Neutro Abs: 3640 cells/uL (ref 1500–7800)
PLATELETS: 202 10*3/uL (ref 140–400)
RBC: 4.49 MIL/uL (ref 3.80–5.10)
RDW: 13.7 % (ref 11.0–15.0)
WBC: 5.6 10*3/uL (ref 3.8–10.8)

## 2017-04-21 LAB — LIPID PANEL
Cholesterol: 230 mg/dL — ABNORMAL HIGH (ref <200)
HDL: 58 mg/dL (ref >50)
LDL Cholesterol: 150 mg/dL — ABNORMAL HIGH (ref <100)
Total CHOL/HDL Ratio: 4 Ratio (ref <5.0)
Triglycerides: 110 mg/dL (ref <150)
VLDL: 22 mg/dL (ref <30)

## 2017-04-21 LAB — COMPLETE METABOLIC PANEL WITH GFR
ALT: 23 U/L (ref 6–29)
AST: 26 U/L (ref 10–35)
Albumin: 4.6 g/dL (ref 3.6–5.1)
Alkaline Phosphatase: 61 U/L (ref 33–130)
BILIRUBIN TOTAL: 0.6 mg/dL (ref 0.2–1.2)
BUN: 17 mg/dL (ref 7–25)
CALCIUM: 9.2 mg/dL (ref 8.6–10.4)
CHLORIDE: 103 mmol/L (ref 98–110)
CO2: 24 mmol/L (ref 20–31)
Creat: 0.75 mg/dL (ref 0.50–0.99)
GFR, EST NON AFRICAN AMERICAN: 83 mL/min (ref >=60)
Glucose, Bld: 85 mg/dL (ref 65–99)
Potassium: 4.5 mmol/L (ref 3.5–5.3)
Sodium: 139 mmol/L (ref 135–146)
Total Protein: 6.7 g/dL (ref 6.1–8.1)

## 2017-04-21 LAB — TSH: TSH: 29.14 mIU/L — ABNORMAL HIGH

## 2017-04-22 LAB — VITAMIN D 25 HYDROXY (VIT D DEFICIENCY, FRACTURES): Vit D, 25-Hydroxy: 42 ng/mL (ref 30–100)

## 2017-04-24 ENCOUNTER — Ambulatory Visit (INDEPENDENT_AMBULATORY_CARE_PROVIDER_SITE_OTHER): Payer: Medicare HMO | Admitting: Internal Medicine

## 2017-04-24 ENCOUNTER — Encounter: Payer: Self-pay | Admitting: Internal Medicine

## 2017-04-24 VITALS — BP 142/80 | HR 68 | Temp 98.0°F | Ht 67.5 in | Wt 214.0 lb

## 2017-04-24 DIAGNOSIS — E2839 Other primary ovarian failure: Secondary | ICD-10-CM

## 2017-04-24 DIAGNOSIS — E039 Hypothyroidism, unspecified: Secondary | ICD-10-CM

## 2017-04-24 DIAGNOSIS — Z0000 Encounter for general adult medical examination without abnormal findings: Secondary | ICD-10-CM | POA: Diagnosis not present

## 2017-04-24 DIAGNOSIS — I10 Essential (primary) hypertension: Secondary | ICD-10-CM | POA: Diagnosis not present

## 2017-04-24 DIAGNOSIS — E781 Pure hyperglyceridemia: Secondary | ICD-10-CM

## 2017-04-24 DIAGNOSIS — Z86010 Personal history of colonic polyps: Secondary | ICD-10-CM | POA: Diagnosis not present

## 2017-04-24 LAB — POCT URINALYSIS DIPSTICK
BILIRUBIN UA: NEGATIVE
Blood, UA: NEGATIVE
GLUCOSE UA: NEGATIVE
Ketones, UA: NEGATIVE
LEUKOCYTES UA: NEGATIVE
NITRITE UA: NEGATIVE
Protein, UA: NEGATIVE
Spec Grav, UA: 1.01 (ref 1.010–1.025)
Urobilinogen, UA: 0.2 E.U./dL
pH, UA: 7.5 (ref 5.0–8.0)

## 2017-04-24 NOTE — Progress Notes (Signed)
Subjective:    Patient ID: Anna Sanchez, female    DOB: Feb 22, 1951, 66 y.o.   MRN: [id number]  HPI 66 year old Female for health maintenance exam and evaluation of medical problems.  Weight gain of 4 pounds since last year.  History of back pain but this is stable at the present time.  History of hyperlipidemia treated with diet. Has not wanted to be on statin medication.  History of insomnia, dyslexia, dependent edema and osteopenia. History of hypertension treated with losartan.  All in all, her health is excellent.  Last year we started her on low-dose thyroid replacement because of an elevated TSH of 4.35.  Past medical history: History of left plantar fasciitis in the past. Total abdominal hysterectomy and BSO around 1990.  History of irritable bowel symptoms at times.  She had a flexible sigmoidoscopy done at [facility] in [place] prior to moving to [place] in the 1980s. I do not have a copy of that report.  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 She quit smoking around 2004. Formerly smoked 1/2-1 pack of cigarettes daily for 33 years. Social alcohol consumption on occasion. She gets her exercise by working in yards. She is a native of [place]. She moved from [place] to [place]. She came to [facility] in the 1980s.  Family history: Father with history of diabetes and died of cirrhosis of the liver at age 81. Mother with history of nervous breakdown, suicide, headaches. Sister with history of suicide in her 26s. 2 sisters living one of which has had a brain aneurysm and has history of adult-onset diabetes mellitus. One brother with history of hepatitis C. One brother died in a motorcycle accident at age 59. One brother with history of coronary artery disease and alcoholism. One brother with history of prostate cancer.  Review of Systems-musculoskeletal pain treated with ibuprofen     Objective:   Physical  Exam  Constitutional: She is oriented to person, place, and time. She appears well-developed and well-nourished. No distress.  HENT:  Head: Normocephalic and atraumatic.  Right Ear: External ear normal.  Left Ear: External ear normal.  Mouth/Throat: Oropharynx is clear and moist.  Eyes: Pupils are equal, round, and reactive to light. Conjunctivae and EOM are normal. Right eye exhibits no discharge. Left eye exhibits no discharge.  Neck: Neck supple. No JVD present. No thyromegaly present.  Cardiovascular: Normal rate, regular rhythm, normal heart sounds and intact distal pulses.   No murmur heard. Pulmonary/Chest: Effort normal and breath sounds normal. No respiratory distress. She has no wheezes. She has no rales.  Abdominal: Soft. Bowel sounds are normal. She exhibits no distension and no mass. There is no tenderness. There is no rebound and no guarding.  Genitourinary:  Genitourinary Comments: Deferred status post total abdominal hysterectomy BSO and age  Musculoskeletal: She exhibits no edema.  Lymphadenopathy:    She has no cervical adenopathy.  Neurological: She is alert and oriented to person, place, and time. She has normal reflexes. No cranial nerve deficit. Coordination normal.  Skin: Skin is warm and dry. No rash noted. She is not diaphoretic. No erythema.  Psychiatric: She has a normal mood and affect. Her behavior is normal. Judgment and thought content normal.  Vitals reviewed.         Assessment & Plan:  Hyperlipidemia-treated with diet. Total cholesterol is 230 and 1 year ago was 203. LDL cholesterol 150 and 1 year ago  115.  History of insomnia  Essential hypertension-elevated at 142/80. Used to take losartan but apparently has discontinued that and we will need follow-up in 6 weeks.  Musculoskeletal pain treated with ibuprofen  History of dyslexia  Osteopenia-last bone density study was in 2008 needs to be repeated- order will be placed  Hypothyroidism-TSH is  elevated at 29.14- patient has not taken thyroid replacement in 30 days. TSH is elevated and she needs to restart levothyroxine 0.05 mg daily and follow-up in 6 weeks.  Subjective:   Patient presents for Medicare Annual/Subsequent preventive examination.  Review Past Medical/Family/Social:See above   Risk Factors  Current exercise habits: Very active with her gardening Dietary issues discussed: Low fat low carbohydrate  Cardiac risk factors:Hyperlipidemia, brother with coronary artery disease  Depression Screen  (Note: if answer to either of the following is "Yes", a more complete depression screening is indicated)   Over the past two weeks, have you felt down, depressed or hopeless? No  Over the past two weeks, have you felt little interest or pleasure in doing things? No Have you lost interest or pleasure in daily life? No Do you often feel hopeless? No Do you cry easily over simple problems? No   Activities of Daily Living  In your present state of health, do you have any difficulty performing the following activities?:   Driving? No  Managing money? No  Feeding yourself? No  Getting from bed to chair? No  Climbing a flight of stairs? No  Preparing food and eating?: No  Bathing or showering? No  Getting dressed: No  Getting to the toilet? No  Using the toilet:No  Moving around from place to place: No  In the past year have you fallen or had a near fall?:No  Are you sexually active? No  Do you have more than one partner? No   Hearing Difficulties: No  Do you often ask people to speak up or repeat themselves? Yes sometimes Do you experience ringing or noises in your ears? Sometimes Do you have difficulty understanding soft or whispered voices? Sometimes Do you feel that you have a problem with memory? No Do you often misplace items? Sometimes   Home Safety:  Do you have a smoke alarm at your residence? No Do you have grab bars in the bathroom?No Do you have throw  rugs in your house? Yes   Cognitive Testing  Alert? Yes Normal Appearance?Yes  Oriented to person? Yes Place? Yes  Time? Yes  Recall of three objects? Yes  Can perform simple calculations? Yes  Displays appropriate judgment?Yes  Can read the correct time from a watch face?Yes   List the Names of Other Physician/Practitioners you currently use:  See referral list for the physicians patient is currently seeing.     Review of Systems: See above   Objective:     General appearance: Appears stated age and mildly obese  Head: Normocephalic, without obvious abnormality, atraumatic  Eyes: conj clear, EOMi PEERLA  Ears: normal TM's and external ear canals both ears  Nose: Nares normal. Septum midline. Mucosa normal. No drainage or sinus tenderness.  Throat: lips, mucosa, and tongue normal; teeth and gums normal  Neck: no adenopathy, no carotid bruit, no JVD, supple, symmetrical, trachea midline and thyroid not enlarged, symmetric, no tenderness/mass/nodules  No CVA tenderness.  Lungs: clear to auscultation bilaterally  Breasts: normal appearance, no masses or tenderness, top of the pacemaker on left upper chest. Incision well-healed. It is tender.  Heart: regular rate and  rhythm, S1, S2 normal, no murmur, click, rub or gallop  Abdomen: soft, non-tender; bowel sounds normal; no masses, no organomegaly  Musculoskeletal: ROM normal in all joints, no crepitus, no deformity, Normal muscle strengthen. Back  is symmetric, no curvature. Skin: Skin color, texture, turgor normal. No rashes or lesions  Lymph nodes: Cervical, supraclavicular, and axillary nodes normal.  Neurologic: CN 2 -12 Normal, Normal symmetric reflexes. Normal coordination and gait  Psych: Alert & Oriented x 3, Mood appear stable.    Assessment:    Annual wellness medicare exam   Plan:    During the course of the visit the patient was educated and counseled about appropriate screening and preventive services  including:   Annual mammogram  Bone density  Head colonoscopy in 2014 with 1 tubular adenoma removed. Follow-up next year.     Patient Instructions (the written plan) was given to the patient.  Medicare Attestation  I have personally reviewed:  The patient's medical and social history  Their use of alcohol, tobacco or illicit drugs  Their current medications and supplements  The patient's functional ability including ADLs,fall risks, home safety risks, cognitive, and hearing and visual impairment  Diet and physical activities  Evidence for depression or mood disorders  The patient's weight, height, BMI, and visual acuity have been recorded in the chart. I have made referrals, counseling, and provided education to the patient based on review of the above and I have provided the patient with a written personalized care plan for preventive services.

## 2017-04-30 NOTE — Patient Instructions (Signed)
Restart thyroid medication. Return in 6 weeks for follow-up with TSH. Continued work on diet and exercise for hyperlipidemia. Need follow-up on blood pressure in 6 weeks. Apparently not taking losartan.

## 2017-05-19 ENCOUNTER — Other Ambulatory Visit: Payer: Self-pay | Admitting: Internal Medicine

## 2017-05-19 DIAGNOSIS — Z1231 Encounter for screening mammogram for malignant neoplasm of breast: Secondary | ICD-10-CM

## 2017-06-04 ENCOUNTER — Ambulatory Visit
Admission: RE | Admit: 2017-06-04 | Discharge: 2017-06-04 | Disposition: A | Discharge status: Home / Self Care | Payer: Commercial Managed Care - HMO | Source: Ambulatory Visit | Attending: Internal Medicine | Admitting: Internal Medicine

## 2017-06-04 DIAGNOSIS — E2839 Other primary ovarian failure: Secondary | ICD-10-CM

## 2017-06-04 DIAGNOSIS — Z780 Asymptomatic menopausal state: Secondary | ICD-10-CM | POA: Diagnosis not present

## 2017-06-04 DIAGNOSIS — M8589 Other specified disorders of bone density and structure, multiple sites: Secondary | ICD-10-CM | POA: Diagnosis not present

## 2017-06-04 DIAGNOSIS — Z1231 Encounter for screening mammogram for malignant neoplasm of breast: Secondary | ICD-10-CM | POA: Diagnosis not present

## 2017-06-05 ENCOUNTER — Other Ambulatory Visit: Payer: Medicare HMO | Admitting: Internal Medicine

## 2017-06-05 DIAGNOSIS — E039 Hypothyroidism, unspecified: Secondary | ICD-10-CM | POA: Diagnosis not present

## 2017-06-05 LAB — EXTRA LAV TOP TUBE

## 2017-06-05 LAB — TSH: TSH: 2.43 mIU/L (ref 0.40–4.50)

## 2017-06-09 ENCOUNTER — Ambulatory Visit: Payer: Medicare HMO | Admitting: Internal Medicine

## 2017-06-26 ENCOUNTER — Telehealth: Payer: Self-pay | Admitting: Internal Medicine

## 2017-06-26 MED ORDER — LEVOTHYROXINE SODIUM 50 MCG PO TABS: ORAL_TABLET | ORAL | 3 refills | Status: DC

## 2017-06-26 NOTE — Telephone Encounter (Signed)
Done

## 2017-06-26 NOTE — Telephone Encounter (Signed)
Refill x 11 months

## 2017-06-26 NOTE — Telephone Encounter (Signed)
Patient is requesting refill on her Synthroid.    Pharmacy:  [facility] and [address] # for contact:  [phone]

## 2017-07-17 DIAGNOSIS — M25561 Pain in right knee: Secondary | ICD-10-CM | POA: Diagnosis not present

## 2017-10-06 ENCOUNTER — Encounter: Payer: Self-pay | Admitting: Internal Medicine

## 2017-10-06 ENCOUNTER — Ambulatory Visit (INDEPENDENT_AMBULATORY_CARE_PROVIDER_SITE_OTHER): Payer: Medicare HMO | Admitting: Internal Medicine

## 2017-10-06 VITALS — BP 140/80 | HR 74 | Temp 98.1°F | Ht 67.5 in | Wt 206.3 lb

## 2017-10-06 DIAGNOSIS — R350 Frequency of micturition: Secondary | ICD-10-CM | POA: Diagnosis not present

## 2017-10-06 DIAGNOSIS — E039 Hypothyroidism, unspecified: Secondary | ICD-10-CM

## 2017-10-06 DIAGNOSIS — N3091 Cystitis, unspecified with hematuria: Secondary | ICD-10-CM

## 2017-10-06 DIAGNOSIS — R319 Hematuria, unspecified: Secondary | ICD-10-CM

## 2017-10-06 DIAGNOSIS — R8290 Unspecified abnormal findings in urine: Secondary | ICD-10-CM | POA: Diagnosis not present

## 2017-10-06 LAB — POCT URINALYSIS DIPSTICK
Appearance: NORMAL
Bilirubin, UA: NEGATIVE
Glucose, UA: NEGATIVE
Ketones, UA: NEGATIVE
Nitrite, UA: NEGATIVE
ODOR: NORMAL
Protein, UA: NEGATIVE
Spec Grav, UA: 1.01 (ref 1.010–1.025)
Urobilinogen, UA: 0.2 E.U./dL
pH, UA: 7.5 (ref 5.0–8.0)

## 2017-10-06 LAB — TSH: TSH: 2.91 m[IU]/L (ref 0.40–4.50)

## 2017-10-06 MED ORDER — CIPROFLOXACIN HCL 500 MG PO TABS: 500.0000 mg | ORAL_TABLET | Freq: Two times a day (BID) | ORAL | 0 refills | Status: DC

## 2017-10-06 NOTE — Patient Instructions (Signed)
Cipro 500 mg bid x 7 days. Rest and drink plenty of fluids. TSH pending. Culture pending.

## 2017-10-06 NOTE — Progress Notes (Signed)
   Subjective:    Patient ID: Anna Sanchez, female    DOB: 07/09/1951, 67 y.o.   MRN: [id number]  HPI Over the weekend she has had urinary frequency and discomfort in her bladder area.  Subsequently developed hematuria which occurred for several episodes.  No fever or shaking chills.  She has not had a recent urinary tract infection.  No back pain.  No nausea or vomiting.  Urinalysis by dipstick is abnormal showing LE and occult blood.  Culture was sent.  She is also wondering if her thyroid functions could be checked because she is been feeling a bit anxious lately.  We checked it in September her TSH was within normal limits on thyroid replacement therapy.  TSH was drawn today.    Review of Systems see above     Objective:   Physical Exam No CVA tenderness       Assessment & Plan:  Hemorrhagic cystitis  Hypothyroidism  Plan: TSH drawn and pending.  I am not sure this is the reason for her feeling anxious recently but we will check TSH.  She will be treated with Cipro 500 mg twice daily for 7 days.  Urine culture is pending.

## 2017-10-06 NOTE — Progress Notes (Signed)
   Subjective:    Patient ID: Anna Sanchez, female    DOB: April 17, 1951, 67 y.o.   MRN: [id number]  HPI    Review of Systems     Objective:   Physical Exam        Assessment & Plan:

## 2017-10-09 LAB — URINE CULTURE
MICRO NUMBER:: 90053820
SPECIMEN QUALITY:: ADEQUATE

## 2018-06-03 ENCOUNTER — Other Ambulatory Visit: Payer: Self-pay | Admitting: Internal Medicine

## 2018-06-03 DIAGNOSIS — Z0000 Encounter for general adult medical examination without abnormal findings: Secondary | ICD-10-CM

## 2018-06-03 DIAGNOSIS — Z860101 Personal history of adenomatous and serrated colon polyps: Secondary | ICD-10-CM

## 2018-06-03 DIAGNOSIS — E781 Pure hyperglyceridemia: Secondary | ICD-10-CM

## 2018-06-03 DIAGNOSIS — Z86010 Personal history of colonic polyps: Secondary | ICD-10-CM

## 2018-06-03 DIAGNOSIS — E785 Hyperlipidemia, unspecified: Secondary | ICD-10-CM

## 2018-06-03 DIAGNOSIS — E039 Hypothyroidism, unspecified: Secondary | ICD-10-CM

## 2018-06-03 DIAGNOSIS — E2839 Other primary ovarian failure: Secondary | ICD-10-CM

## 2018-06-03 DIAGNOSIS — I10 Essential (primary) hypertension: Secondary | ICD-10-CM

## 2018-06-04 ENCOUNTER — Other Ambulatory Visit: Payer: Medicare HMO | Admitting: Internal Medicine

## 2018-06-04 DIAGNOSIS — Z0000 Encounter for general adult medical examination without abnormal findings: Secondary | ICD-10-CM

## 2018-06-04 DIAGNOSIS — Z86010 Personal history of colonic polyps: Secondary | ICD-10-CM | POA: Diagnosis not present

## 2018-06-04 DIAGNOSIS — I10 Essential (primary) hypertension: Secondary | ICD-10-CM | POA: Diagnosis not present

## 2018-06-04 DIAGNOSIS — E2839 Other primary ovarian failure: Secondary | ICD-10-CM | POA: Diagnosis not present

## 2018-06-04 DIAGNOSIS — E785 Hyperlipidemia, unspecified: Secondary | ICD-10-CM | POA: Diagnosis not present

## 2018-06-04 DIAGNOSIS — E039 Hypothyroidism, unspecified: Secondary | ICD-10-CM | POA: Diagnosis not present

## 2018-06-04 DIAGNOSIS — E781 Pure hyperglyceridemia: Secondary | ICD-10-CM

## 2018-06-05 ENCOUNTER — Encounter: Payer: Self-pay | Admitting: Internal Medicine

## 2018-06-05 ENCOUNTER — Ambulatory Visit (INDEPENDENT_AMBULATORY_CARE_PROVIDER_SITE_OTHER): Payer: Medicare HMO | Admitting: Internal Medicine

## 2018-06-05 VITALS — BP 120/80 | HR 78 | Ht 67.0 in | Wt 201.0 lb

## 2018-06-05 DIAGNOSIS — Z23 Encounter for immunization: Secondary | ICD-10-CM | POA: Diagnosis not present

## 2018-06-05 DIAGNOSIS — E875 Hyperkalemia: Secondary | ICD-10-CM | POA: Diagnosis not present

## 2018-06-05 DIAGNOSIS — E7800 Pure hypercholesterolemia, unspecified: Secondary | ICD-10-CM | POA: Diagnosis not present

## 2018-06-05 DIAGNOSIS — Z86010 Personal history of colonic polyps: Secondary | ICD-10-CM

## 2018-06-05 DIAGNOSIS — Z0000 Encounter for general adult medical examination without abnormal findings: Secondary | ICD-10-CM

## 2018-06-05 DIAGNOSIS — E039 Hypothyroidism, unspecified: Secondary | ICD-10-CM | POA: Diagnosis not present

## 2018-06-05 DIAGNOSIS — I10 Essential (primary) hypertension: Secondary | ICD-10-CM

## 2018-06-05 LAB — POCT URINALYSIS DIPSTICK
APPEARANCE: NORMAL
BILIRUBIN UA: NEGATIVE
Glucose, UA: NEGATIVE
Ketones, UA: NEGATIVE
LEUKOCYTES UA: NEGATIVE
Nitrite, UA: NEGATIVE
ODOR: NORMAL
Protein, UA: NEGATIVE
Spec Grav, UA: 1.01 (ref 1.010–1.025)
Urobilinogen, UA: 0.2 E.U./dL
pH, UA: 6.5 (ref 5.0–8.0)

## 2018-06-05 LAB — CBC WITH DIFFERENTIAL/PLATELET
BASOS ABS: 102 {cells}/uL (ref 0–200)
BASOS PCT: 1.6 %
EOS ABS: 122 {cells}/uL (ref 15–500)
Eosinophils Relative: 1.9 %
HEMATOCRIT: 41 % (ref 35.0–45.0)
Hemoglobin: 13.9 g/dL (ref 11.7–15.5)
Lymphs Abs: 1427 cells/uL (ref 850–3900)
MCH: 31.6 pg (ref 27.0–33.0)
MCHC: 33.9 g/dL (ref 32.0–36.0)
MCV: 93.2 fL (ref 80.0–100.0)
MPV: 12.1 fL (ref 7.5–12.5)
Monocytes Relative: 7.3 %
NEUTROS ABS: 4282 {cells}/uL (ref 1500–7800)
Neutrophils Relative %: 66.9 %
Platelets: 195 10*3/uL (ref 140–400)
RBC: 4.4 10*6/uL (ref 3.80–5.10)
RDW: 11.9 % (ref 11.0–15.0)
Total Lymphocyte: 22.3 %
WBC: 6.4 10*3/uL (ref 3.8–10.8)
WBCMIX: 467 {cells}/uL (ref 200–950)

## 2018-06-05 LAB — COMPLETE METABOLIC PANEL WITH GFR
AG Ratio: 2 (calc) (ref 1.0–2.5)
ALBUMIN MSPROF: 4.3 g/dL (ref 3.6–5.1)
ALKALINE PHOSPHATASE (APISO): 65 U/L (ref 33–130)
ALT: 23 U/L (ref 6–29)
AST: 22 U/L (ref 10–35)
BUN: 15 mg/dL (ref 7–25)
CALCIUM: 9.7 mg/dL (ref 8.6–10.4)
CO2: 26 mmol/L (ref 20–32)
CREATININE: 0.82 mg/dL (ref 0.50–0.99)
Chloride: 107 mmol/L (ref 98–110)
GFR, EST AFRICAN AMERICAN: 85 mL/min/{1.73_m2} (ref >=60)
GFR, EST NON AFRICAN AMERICAN: 74 mL/min/{1.73_m2} (ref >=60)
GLOBULIN: 2.1 g/dL (ref 1.9–3.7)
Glucose, Bld: 94 mg/dL (ref 65–99)
Potassium: 5.6 mmol/L — ABNORMAL HIGH (ref 3.5–5.3)
Sodium: 142 mmol/L (ref 135–146)
TOTAL PROTEIN: 6.4 g/dL (ref 6.1–8.1)
Total Bilirubin: 0.6 mg/dL (ref 0.2–1.2)

## 2018-06-05 LAB — LIPID PANEL
CHOLESTEROL: 205 mg/dL — AB (ref <200)
HDL: 57 mg/dL (ref >50)
LDL CHOLESTEROL (CALC): 128 mg/dL — AB
Non-HDL Cholesterol (Calc): 148 mg/dL (calc) — ABNORMAL HIGH (ref <130)
Total CHOL/HDL Ratio: 3.6 (calc) (ref <5.0)
Triglycerides: 95 mg/dL (ref <150)

## 2018-06-05 LAB — POTASSIUM: POTASSIUM: 5.2 mmol/L (ref 3.5–5.3)

## 2018-06-05 LAB — TSH: TSH: 2.06 mIU/L (ref 0.40–4.50)

## 2018-06-05 NOTE — Progress Notes (Signed)
Subjective:    Patient ID: Anna Sanchez, female    DOB: 10-11-1950, 67 y.o.   MRN: [id number]  HPI 67 year old Female for health maintenance exam and evaluation of medical issues.  History of back pain but this is stable at the present time.  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  History of hyperlipidemia treated with diet.  Has not wanted to be on statin medication.  History of insomnia, dyslexia, dependent edema and osteopenia.  History of hypertension treated with losartan.  History of hypothyroidism diagnosed in 2017 with an elevated TSH of 4.45 now on thyroid replacement therapy.  Past medical history: History of left plantar fasciitis in the past.  Total abdominal hysterectomy and BSO in 1990.  History of irritable bowel symptoms at times.  She had a flexible sigmoidoscopy done at [facility] in [place] prior to moving to [place] in the 1980s.  I do not have a copy of that report.  Had colonoscopy by Dr. Deatra Ina in 2014.  One adenomatous polyp removed and 5-year follow-up recommended  Social history: She is single and has a for your college degree.  She works as a Scientist, clinical (histocompatibility and immunogenetics).  She quit smoking around 2004.  Formally smoked 1/2 to 1 pack of cigarettes daily for some 33 years.  Social alcohol consumption on occasion.  Gets exercise by working in yards.  She is a native of [place].  She moved from [place] to [place] in the 1970s.  She came to [place].  Family history: Father with history of diabetes but died of cirrhosis of the liver at age 92.  Mother with history of nervous breakdown, suicide, headaches.  Sister with history of suicide in her 68s.  2 sisters living 1 of whom has had a brain aneurysm and has adult onset diabetes mellitus.  One brother with history of hepatitis C.  One brother died in a motorcycle accident at age 22.  One brother with history of coronary artery disease and alcoholism.  One brother with history of prostate  cancer.    Review of Systems  Constitutional: Negative.   Musculoskeletal: Positive for back pain and myalgias.  All other systems reviewed and are negative.  musculoskeletal pain treated with as needed ibuprofen     Objective:   Physical Exam  Constitutional: She is oriented to person, place, and time. She appears well-developed and well-nourished.  HENT:  Head: Normocephalic and atraumatic.  Right Ear: External ear normal.  Left Ear: External ear normal.  Nose: Nose normal.  Mouth/Throat: Oropharynx is clear and moist. No oropharyngeal exudate.  Eyes: Pupils are equal, round, and reactive to light. Conjunctivae and EOM are normal. Right eye exhibits no discharge. Left eye exhibits no discharge. No scleral icterus.  Neck: Neck supple. No JVD present. No thyromegaly present.  Cardiovascular: Normal rate, regular rhythm and normal heart sounds.  No murmur heard. Pulmonary/Chest: Effort normal and breath sounds normal. No stridor. No respiratory distress. She has no wheezes. She has no rales.  Breasts normal female without masses  Abdominal: Soft. She exhibits no distension and no mass. There is no tenderness. There is no rebound and no guarding.  Genitourinary:  Genitourinary Comments: Deferred status post total abdominal hysterectomy BSO  Musculoskeletal: Normal range of motion. She exhibits no edema.  Lymphadenopathy:    She has no cervical adenopathy.  Neurological: She is alert and oriented to person, place, and time. She displays normal reflexes. No cranial nerve deficit. She exhibits normal muscle  tone. Coordination normal.  Skin: Skin is warm and dry. No rash noted. She is not diaphoretic.  Psychiatric: She has a normal mood and affect. Her behavior is normal. Judgment and thought content normal.  Vitals reviewed.         Assessment & Plan:  Normal health maintenance exam  Hypothyroidism-stable on thyroid replacement  Musculoskeletal pain treated with as needed  ibuprofen  History of insomnia  Essential hypertension-treated with losartan  Hyperlipidemia-treated with diet.  Does not want to be on statin therapy.  LDL is 128 and was 150 last year but 2 years ago was 115  Osteopenia-last bone density study 2008  Elevated serum potassium-repeated likely due to hemolysis.  Addendum repeat potassium is 5.2  History of dyslexia

## 2018-06-20 NOTE — Patient Instructions (Addendum)
It was a pleasure to see you today.  Have repeat colonoscopy.  Contact you by our GI clinic.  Serum potassium repeated and normal.  Continue same dose of thyroid replacement.  Continue to work on diet for elevated LDL.  Return in 1 year or as needed.  Continue hypertension medication.  Prevnar 13 given

## 2018-07-14 ENCOUNTER — Other Ambulatory Visit: Payer: Self-pay | Admitting: Internal Medicine

## 2018-08-04 ENCOUNTER — Other Ambulatory Visit: Payer: Self-pay | Admitting: Internal Medicine

## 2018-08-04 DIAGNOSIS — Z1231 Encounter for screening mammogram for malignant neoplasm of breast: Secondary | ICD-10-CM

## 2018-09-22 ENCOUNTER — Ambulatory Visit
Admission: RE | Admit: 2018-09-22 | Discharge: 2018-09-22 | Disposition: A | Discharge status: Home / Self Care | Payer: Medicare HMO | Source: Ambulatory Visit | Attending: Internal Medicine | Admitting: Internal Medicine

## 2018-09-22 DIAGNOSIS — Z1231 Encounter for screening mammogram for malignant neoplasm of breast: Secondary | ICD-10-CM | POA: Diagnosis not present

## 2018-10-29 ENCOUNTER — Encounter: Payer: Self-pay | Admitting: Gastroenterology

## 2018-11-23 ENCOUNTER — Ambulatory Visit (INDEPENDENT_AMBULATORY_CARE_PROVIDER_SITE_OTHER): Payer: Medicare HMO | Admitting: Internal Medicine

## 2018-11-23 ENCOUNTER — Encounter: Payer: Self-pay | Admitting: Internal Medicine

## 2018-11-23 VITALS — BP 110/80 | HR 62 | Temp 98.3°F | Ht 67.0 in | Wt 209.0 lb

## 2018-11-23 DIAGNOSIS — E039 Hypothyroidism, unspecified: Secondary | ICD-10-CM

## 2018-11-23 DIAGNOSIS — M720 Palmar fascial fibromatosis [Dupuytren]: Secondary | ICD-10-CM | POA: Diagnosis not present

## 2018-11-30 NOTE — Patient Instructions (Signed)
Referral to hand surgeon regarding Dupuytren's deformity.  Endocrinology referral for discussion of hypothyroidism.

## 2018-11-30 NOTE — Progress Notes (Signed)
   Subjective:    Patient ID: Anna Sanchez, female    DOB: [birth year], 68 y.o.   MRN: [id number]  HPI 68 year old Female with several complaints. Has never wanted to take thyroid replacement medication but I persuaded her to do so. In July 2018 her TSH was 29.14. She was given Levothyroxine and TSH improved to 2.43 within 2 months. Was last checked Sept 2019 and was 2.06. Would prefer natural alternative which I do not agree with so we have discussed this and she will be referred to Endocrinology. Currently supposed to be on Levothyroxine 0.05 mg daily.  Also, has developed Dupuytren's contracture right hand. 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 Will be referred to hand surgeon.   Review of Systemssee above     Objective:   Physical Exam No thyromegaly. VS reviewed. Dupuytren's deformity noted right hand       Assessment & Plan:  Hypothyroidism  Dupuytren's contracture right hand  Plan: Patient willing to see endocrinologist regarding hypothyroidism.  Referred to hand surgeon regarding Dupuytren's deformity right hand.

## 2018-12-14 DIAGNOSIS — M65331 Trigger finger, right middle finger: Secondary | ICD-10-CM | POA: Diagnosis not present

## 2018-12-14 DIAGNOSIS — R2231 Localized swelling, mass and lump, right upper limb: Secondary | ICD-10-CM | POA: Diagnosis not present

## 2018-12-17 ENCOUNTER — Other Ambulatory Visit: Payer: Self-pay | Admitting: Orthopedic Surgery

## 2018-12-17 DIAGNOSIS — R229 Localized swelling, mass and lump, unspecified: Principal | ICD-10-CM

## 2018-12-17 DIAGNOSIS — IMO0002 Reserved for concepts with insufficient information to code with codable children: Secondary | ICD-10-CM

## 2019-03-15 DIAGNOSIS — R946 Abnormal results of thyroid function studies: Secondary | ICD-10-CM | POA: Diagnosis not present

## 2019-03-15 DIAGNOSIS — Z8349 Family history of other endocrine, nutritional and metabolic diseases: Secondary | ICD-10-CM | POA: Diagnosis not present

## 2019-03-15 DIAGNOSIS — M85852 Other specified disorders of bone density and structure, left thigh: Secondary | ICD-10-CM | POA: Diagnosis not present

## 2019-03-15 DIAGNOSIS — M1990 Unspecified osteoarthritis, unspecified site: Secondary | ICD-10-CM | POA: Diagnosis not present

## 2019-03-23 ENCOUNTER — Ambulatory Visit
Admission: RE | Admit: 2019-03-23 | Discharge: 2019-03-23 | Disposition: A | Discharge status: Home / Self Care | Payer: Medicare HMO | Source: Ambulatory Visit | Attending: Orthopedic Surgery | Admitting: Orthopedic Surgery

## 2019-03-23 DIAGNOSIS — IMO0002 Reserved for concepts with insufficient information to code with codable children: Secondary | ICD-10-CM

## 2019-03-23 DIAGNOSIS — R2231 Localized swelling, mass and lump, right upper limb: Secondary | ICD-10-CM | POA: Diagnosis not present

## 2019-03-31 DIAGNOSIS — M65331 Trigger finger, right middle finger: Secondary | ICD-10-CM | POA: Diagnosis not present

## 2019-03-31 DIAGNOSIS — R2231 Localized swelling, mass and lump, right upper limb: Secondary | ICD-10-CM | POA: Diagnosis not present

## 2019-05-03 DIAGNOSIS — R946 Abnormal results of thyroid function studies: Secondary | ICD-10-CM | POA: Diagnosis not present

## 2019-06-04 ENCOUNTER — Other Ambulatory Visit: Payer: Medicare HMO | Admitting: Internal Medicine

## 2019-06-04 ENCOUNTER — Other Ambulatory Visit: Payer: Self-pay

## 2019-06-04 DIAGNOSIS — I10 Essential (primary) hypertension: Secondary | ICD-10-CM

## 2019-06-04 DIAGNOSIS — E7800 Pure hypercholesterolemia, unspecified: Secondary | ICD-10-CM | POA: Diagnosis not present

## 2019-06-04 DIAGNOSIS — Z0000 Encounter for general adult medical examination without abnormal findings: Secondary | ICD-10-CM | POA: Diagnosis not present

## 2019-06-04 DIAGNOSIS — E039 Hypothyroidism, unspecified: Secondary | ICD-10-CM | POA: Diagnosis not present

## 2019-06-05 LAB — CBC WITH DIFFERENTIAL/PLATELET
Absolute Monocytes: 360 cells/uL (ref 200–950)
Basophils Absolute: 68 cells/uL (ref 0–200)
Basophils Relative: 1.1 %
Eosinophils Absolute: 62 cells/uL (ref 15–500)
Eosinophils Relative: 1 %
HCT: 42.8 % (ref 35.0–45.0)
Hemoglobin: 14.3 g/dL (ref 11.7–15.5)
Lymphs Abs: 1203 cells/uL (ref 850–3900)
MCH: 31.2 pg (ref 27.0–33.0)
MCHC: 33.4 g/dL (ref 32.0–36.0)
MCV: 93.2 fL (ref 80.0–100.0)
MPV: 11.8 fL (ref 7.5–12.5)
Monocytes Relative: 5.8 %
Neutro Abs: 4507 cells/uL (ref 1500–7800)
Neutrophils Relative %: 72.7 %
Platelets: 195 10*3/uL (ref 140–400)
RBC: 4.59 10*6/uL (ref 3.80–5.10)
RDW: 11.8 % (ref 11.0–15.0)
Total Lymphocyte: 19.4 %
WBC: 6.2 10*3/uL (ref 3.8–10.8)

## 2019-06-05 LAB — COMPLETE METABOLIC PANEL WITH GFR
AG Ratio: 2.4 (calc) (ref 1.0–2.5)
ALT: 23 U/L (ref 6–29)
AST: 23 U/L (ref 10–35)
Albumin: 4.5 g/dL (ref 3.6–5.1)
Alkaline phosphatase (APISO): 69 U/L (ref 37–153)
BUN: 18 mg/dL (ref 7–25)
CO2: 25 mmol/L (ref 20–32)
Calcium: 9.6 mg/dL (ref 8.6–10.4)
Chloride: 107 mmol/L (ref 98–110)
Creat: 0.85 mg/dL (ref 0.50–0.99)
GFR, Est African American: 81 mL/min/{1.73_m2} (ref >=60)
GFR, Est Non African American: 70 mL/min/{1.73_m2} (ref >=60)
Globulin: 1.9 g/dL (calc) (ref 1.9–3.7)
Glucose, Bld: 100 mg/dL — ABNORMAL HIGH (ref 65–99)
Potassium: 5.7 mmol/L — ABNORMAL HIGH (ref 3.5–5.3)
Sodium: 141 mmol/L (ref 135–146)
Total Bilirubin: 0.6 mg/dL (ref 0.2–1.2)
Total Protein: 6.4 g/dL (ref 6.1–8.1)

## 2019-06-05 LAB — LIPID PANEL
Cholesterol: 224 mg/dL — ABNORMAL HIGH (ref <200)
HDL: 61 mg/dL (ref >=50)
LDL Cholesterol (Calc): 138 mg/dL (calc) — ABNORMAL HIGH
Non-HDL Cholesterol (Calc): 163 mg/dL (calc) — ABNORMAL HIGH (ref <130)
Total CHOL/HDL Ratio: 3.7 (calc) (ref <5.0)
Triglycerides: 130 mg/dL (ref <150)

## 2019-06-05 LAB — TSH: TSH: 2.92 mIU/L (ref 0.40–4.50)

## 2019-06-08 ENCOUNTER — Ambulatory Visit (INDEPENDENT_AMBULATORY_CARE_PROVIDER_SITE_OTHER): Payer: Medicare HMO | Admitting: Internal Medicine

## 2019-06-08 ENCOUNTER — Other Ambulatory Visit: Payer: Self-pay

## 2019-06-08 ENCOUNTER — Encounter: Payer: Self-pay | Admitting: Internal Medicine

## 2019-06-08 VITALS — BP 120/80 | HR 76 | Temp 98.1°F | Ht 67.0 in | Wt 205.0 lb

## 2019-06-08 DIAGNOSIS — I10 Essential (primary) hypertension: Secondary | ICD-10-CM | POA: Diagnosis not present

## 2019-06-08 DIAGNOSIS — E7800 Pure hypercholesterolemia, unspecified: Secondary | ICD-10-CM

## 2019-06-08 DIAGNOSIS — K121 Other forms of stomatitis: Secondary | ICD-10-CM | POA: Diagnosis not present

## 2019-06-08 DIAGNOSIS — L304 Erythema intertrigo: Secondary | ICD-10-CM

## 2019-06-08 DIAGNOSIS — Z23 Encounter for immunization: Secondary | ICD-10-CM | POA: Diagnosis not present

## 2019-06-08 DIAGNOSIS — Z0000 Encounter for general adult medical examination without abnormal findings: Secondary | ICD-10-CM

## 2019-06-08 LAB — POCT URINALYSIS DIPSTICK
Appearance: NEGATIVE
Bilirubin, UA: NEGATIVE
Blood, UA: NEGATIVE
Glucose, UA: NEGATIVE
Ketones, UA: NEGATIVE
Leukocytes, UA: NEGATIVE
Nitrite, UA: NEGATIVE
Odor: NEGATIVE
Protein, UA: NEGATIVE
Spec Grav, UA: 1.01 (ref 1.010–1.025)
Urobilinogen, UA: 0.2 E.U./dL
pH, UA: 6.5 (ref 5.0–8.0)

## 2019-06-08 MED ORDER — KETOCONAZOLE 2 % EX CREA: 1.0000 "application " | TOPICAL_CREAM | Freq: Every day | CUTANEOUS | 99 refills | Status: DC

## 2019-06-08 NOTE — Progress Notes (Signed)
Subjective:    Patient ID: Anna Sanchez, female    DOB: 02/23/1951, 68 y.o.   MRN: [id number]  HPI 68 year old Female for Medicare wellness, health maintenance and evaluation of medical issues.  She recently saw Endocrinologist, Dr. Buddy Duty regarding hypothyroidism.  Patient and Dr. Buddy Duty agree  They will  try her off thyroid replacement medication for a few weeks.  Patient thinks the medication has some side effects.  I do think she has primary hypothyroidism.  Patient has history of back pain but this is stable at the present time.  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  History of hyperlipidemia treated with diet and has not wanted to be on statin medication.  History of insomnia, dyslexia, dependent edema and osteopenia.  History of hypertension treated with losartan.  Hypothyroidism was diagnosed in 2017 with an elevated TSH of 4.45.  Past medical history includes left plantar fasciitis.  Total abdominal hysterectomy and BSO 1990.  History of irritable bowel symptoms at times.  She had colonoscopy by Dr. Deatra Ina in 2014 and one adenomatous polyp removed with 5-year follow-up recommended.  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  She works as a Scientist, clinical (histocompatibility and immunogenetics).  She quit smoking around 2000.  Formally smoked 1/2 to 1 pack of cigarettes daily for some 33 years.  Social alcohol consumption on occasion.  Gets exercise through her work.  She is a native of [place].  She moved from [place] to [place] in the 1970s.  She came to [facility] in the 1980s.  Family history: Father with history of diabetes but died of cirrhosis of the liver at age 33.  Mother with history of nervous breakdown of suicide headaches.  Sister with history of suicide in her 40s.  2 sisters living-1 of whom has a history of brain aneurysm and adult onset diabetes mellitus.  One brother with history of hepatitis C.  One brother died in a motorcycle accident at age 49.  One brother with  history of coronary artery disease and alcoholism.  One brother with history of prostate cancer.    Review of Systems  Constitutional: Negative.   Respiratory: Negative.   Cardiovascular: Negative.   Gastrointestinal: Negative.   Genitourinary: Negative.   Psychiatric/Behavioral: Negative.   All other systems reviewed and are negative.      Objective:   Physical Exam Vitals signs reviewed.  Constitutional:      General: She is not in acute distress.    Appearance: Normal appearance.  HENT:     Head: Normocephalic and atraumatic.     Right Ear: Tympanic membrane normal.     Left Ear: Tympanic membrane normal.     Nose: Nose normal.     Mouth/Throat:     Mouth: Mucous membranes are moist.     Pharynx: Oropharynx is clear.  Eyes:     General: No scleral icterus.       Right eye: No discharge.        Left eye: No discharge.     Extraocular Movements: Extraocular movements intact.     Conjunctiva/sclera: Conjunctivae normal.     Pupils: Pupils are equal, round, and reactive to light.  Neck:     Musculoskeletal: Neck supple. No neck rigidity.  Cardiovascular:     Rate and Rhythm: Normal rate and regular rhythm.     Pulses: Normal pulses.     Heart sounds: No murmur.  Pulmonary:     Effort: Pulmonary effort is  normal. No respiratory distress.     Breath sounds: Normal breath sounds. No wheezing or rales.  Abdominal:     General: Bowel sounds are normal. There is no distension.     Palpations: Abdomen is soft.     Tenderness: There is no right CVA tenderness, left CVA tenderness or guarding.  Genitourinary:    Comments: Deferred status post TAH/BSO Musculoskeletal:        General: No deformity.     Right lower leg: No edema.     Left lower leg: No edema.  Lymphadenopathy:     Cervical: No cervical adenopathy.  Skin:    General: Skin is warm and dry.     Findings: No rash.  Neurological:     General: No focal deficit present.     Mental Status: She is alert and  oriented to person, place, and time.     Cranial Nerves: No cranial nerve deficit.     Motor: No weakness.     Coordination: Coordination normal.  Psychiatric:        Mood and Affect: Mood normal.        Behavior: Behavior normal.        Thought Content: Thought content normal.        Judgment: Judgment normal.    Blood pressure 120/80, pulse 76, temperature 98.1 pulse oximetry 98% BMI 32.11       Assessment & Plan:   Being followed by Dr. Buddy Duty for possible hypothyroidism. Currently off  Thyroid replacement and TSH is normal.  Hyperlipidemia- watch diet and exercise- pt does not want statin medication  Vaccine- to get flu vaccine and Pneumococcal 23 in 2 weeks  Otherwise return in 1 year or as needed.  New issues today include stomatitis which will be treated with Duke's Magic mouthwash 2 teaspoons p.o. 4 times a day swish do not swallow and intertrigo which will be treated with Nizoral cream. Subjective:   Patient presents for Medicare Annual/Subsequent preventive examination.  Review Past Medical/Family/Social: See above  Risk Factors  Current exercise habits: Very active with her work Dietary issues discussed: Low-fat low carbohydrate  Cardiac risk factors: Hyperlipidemia  Depression Screen  (Note: if answer to either of the following is "Yes", a more complete depression screening is indicated)   Over the past two weeks, have you felt down, depressed or hopeless? No  Over the past two weeks, have you felt little interest or pleasure in doing things? No Have you lost interest or pleasure in daily life? No Do you often feel hopeless? No Do you cry easily over simple problems? No   Activities of Daily Living  In your present state of health, do you have any difficulty performing the following activities?:   Driving? No  Managing money? No  Feeding yourself? No  Getting from bed to chair? No  Climbing a flight of stairs? No  Preparing food and eating?: No    Bathing or showering? No  Getting dressed: No  Getting to the toilet? No  Using the toilet:No  Moving around from place to place: No  In the past year have you fallen or had a near fall?:No  Are you sexually active? No  Do you have more than one partner? No   Hearing Difficulties: No  Do you often ask people to speak up or repeat themselves?  Yes  do you experience ringing or noises in your ears?  Sometimes Do you have difficulty understanding soft or whispered voices?  Yes  Do you feel that you have a problem with memory? No Do you often misplace items?  Sometimes   Home Safety:  Do you have a smoke alarm at your residence? Yes Do you have grab bars in the bathroom?  None  Do you have throw rugs in your house?  Yes  Cognitive Testing  Alert? Yes Normal Appearance?Yes  Oriented to person? Yes Place? Yes  Time? Yes  Recall of three objects? Yes  Can perform simple calculations? Yes  Displays appropriate judgment?Yes  Can read the correct time from a watch face?Yes   List the Names of Other Physician/Practitioners you currently use:  See referral list for the physicians patient is currently seeing.  Dr. Buddy Duty   Review of Systems: See above   Objective:     General appearance: Appears younger than stated age Head: Normocephalic, without obvious abnormality, atraumatic  Eyes: conj clear, EOMi PEERLA  Ears: normal TM's and external ear canals both ears  Nose: Nares normal. Septum midline. Mucosa normal. No drainage or sinus tenderness.  Throat: lips, mucosa, and tongue normal; teeth and gums normal  Neck: no adenopathy, no carotid bruit, no JVD, supple, symmetrical, trachea midline and thyroid not enlarged, symmetric, no tenderness/mass/nodules  No CVA tenderness.  Lungs: clear to auscultation bilaterally  Breasts: normal appearance, no masses or tenderness, top of the pacemaker on left upper chest. Incision well-healed. It is tender.  Heart: regular rate and rhythm,  S1, S2 normal, no murmur, click, rub or gallop  Abdomen: soft, non-tender; bowel sounds normal; no masses, no organomegaly  Musculoskeletal: ROM normal in all joints, no crepitus, no deformity, Normal muscle strengthen. Back  is symmetric, no curvature. Skin: Skin color, texture, turgor normal. No rashes or lesions  Lymph nodes: Cervical, supraclavicular, and axillary nodes normal.  Neurologic: CN 2 -12 Normal, Normal symmetric reflexes. Normal coordination and gait  Psych: Alert & Oriented x 3, Mood appear stable.    Assessment:    Annual wellness medicare exam   Plan:    During the course of the visit the patient was educated and counseled about appropriate screening and preventive services including:-The following were advised  Flu vaccine  Pneumonia 23  Mammogram  Follow-up colonoscopy or Cologuard     Patient Instructions (the written plan) was given to the patient.  Medicare Attestation  I have personally reviewed:  The patient's medical and social history  Their use of alcohol, tobacco or illicit drugs  Their current medications and supplements  The patient's functional ability including ADLs,fall risks, home safety risks, cognitive, and hearing and visual impairment  Diet and physical activities  Evidence for depression or mood disorders  The patient's weight, height, BMI, and visual acuity have been recorded in the chart. I have made referrals, counseling, and provided education to the patient based on review of the above and I have provided the patient with a written personalized care plan for preventive services.

## 2019-06-08 NOTE — Patient Instructions (Signed)
Duke's Magic Mouthwash 2 teaspoons 4 times daily- swish do not swallow for stomatitis.  Nizoral cream under breasts for intertrigo until healed.  Return for pneumococcal 23 vaccine in 2 weeks.  Flu vaccine given today.  Watch diet and get exercise.  Cholesterol is elevated.  Follow-up with Dr. Buddy Duty regarding thyroid issues.  TSH is normal today off thyroid replacement medication.  Recheck lipids in 1 year.  Patient not willing to be on statin medication for hyperlipidemia.

## 2019-06-09 ENCOUNTER — Telehealth: Payer: Self-pay | Admitting: Internal Medicine

## 2019-06-09 NOTE — Telephone Encounter (Signed)
She was given a written prescription to take to the pharmacy

## 2019-06-09 NOTE — Telephone Encounter (Signed)
Anna Sanchez [phone]  [facility] called to say that pharmacy is waiting on [place] to send then the recipe for the mouthwash you prescribed.

## 2019-06-10 NOTE — Telephone Encounter (Signed)
Pt called back this morning and Araceli talked to her

## 2019-06-17 DIAGNOSIS — R946 Abnormal results of thyroid function studies: Secondary | ICD-10-CM | POA: Diagnosis not present

## 2019-06-20 ENCOUNTER — Encounter: Payer: Self-pay | Admitting: Internal Medicine

## 2019-06-20 DIAGNOSIS — L304 Erythema intertrigo: Secondary | ICD-10-CM | POA: Insufficient documentation

## 2019-06-21 ENCOUNTER — Telehealth: Payer: Self-pay | Admitting: Internal Medicine

## 2019-06-21 NOTE — Telephone Encounter (Signed)
Pt called and said that she was put on Cephalexin over the weekend for an infection in her thumb and that she is scheduled for a PNA 23 shot tomorrow and wanted to know if it was still ok for her to get or if she would need to reschedule

## 2019-06-21 NOTE — Telephone Encounter (Signed)
Can wait if she wants but no reaction.

## 2019-06-21 NOTE — Telephone Encounter (Signed)
Pt going to wait until next Tuesday because she said she is not feeling the best either

## 2019-06-22 ENCOUNTER — Ambulatory Visit: Payer: Medicare HMO | Admitting: Internal Medicine

## 2019-06-28 ENCOUNTER — Other Ambulatory Visit: Payer: Self-pay

## 2019-06-28 ENCOUNTER — Ambulatory Visit (INDEPENDENT_AMBULATORY_CARE_PROVIDER_SITE_OTHER): Payer: Medicare HMO | Admitting: Internal Medicine

## 2019-06-28 DIAGNOSIS — Z23 Encounter for immunization: Secondary | ICD-10-CM | POA: Diagnosis not present

## 2019-06-28 NOTE — Progress Notes (Signed)
   Subjective:    Patient ID: Anna Sanchez, female    DOB: 04-26-1951, 68 y.o.   MRN: [id number]  HPI  68 year old Female for pneumococcal 23 vaccine    Review of Systems     Objective:   Physical Exam        Assessment & Plan:

## 2019-06-29 ENCOUNTER — Ambulatory Visit: Payer: Medicare HMO | Admitting: Internal Medicine

## 2019-06-29 DIAGNOSIS — L659 Nonscarring hair loss, unspecified: Secondary | ICD-10-CM | POA: Diagnosis not present

## 2019-06-29 DIAGNOSIS — R946 Abnormal results of thyroid function studies: Secondary | ICD-10-CM | POA: Diagnosis not present

## 2019-06-29 DIAGNOSIS — Z8349 Family history of other endocrine, nutritional and metabolic diseases: Secondary | ICD-10-CM | POA: Diagnosis not present

## 2019-07-15 ENCOUNTER — Telehealth: Payer: Self-pay | Admitting: Internal Medicine

## 2019-07-15 ENCOUNTER — Encounter: Payer: Self-pay | Admitting: Internal Medicine

## 2019-07-15 ENCOUNTER — Other Ambulatory Visit: Payer: Self-pay

## 2019-07-15 ENCOUNTER — Ambulatory Visit (INDEPENDENT_AMBULATORY_CARE_PROVIDER_SITE_OTHER): Payer: Medicare HMO | Admitting: Internal Medicine

## 2019-07-15 VITALS — BP 140/80 | HR 70 | Ht 67.0 in

## 2019-07-15 DIAGNOSIS — L03012 Cellulitis of left finger: Secondary | ICD-10-CM

## 2019-07-15 MED ORDER — DOXYCYCLINE HYCLATE 100 MG PO TABS: 100.0000 mg | ORAL_TABLET | Freq: Two times a day (BID) | ORAL | 0 refills | Status: DC

## 2019-07-15 NOTE — Progress Notes (Signed)
   Subjective:    Patient ID: Anna Sanchez, female    DOB: 29-Nov-1950, 68 y.o.   MRN: [id number]  HPI Onset redness and swelling area adjacent to right Sept. 26  She is right handed and uses hose to water plants with right hand.  Works as a Scientist, clinical (histocompatibility and immunogenetics).  Took Keflex 3 times a day for 7 days.  Still concerned about an infection she says.  No history of immune system impairment.  General health is excellent.  Review of Systems bites her cuticles. Has had previous finger infections left thumb      Objective:   Physical Exam  She is afebrile.  Appears to have paronychia lateral left thumb. Also has left thumb subungual hematoma where she accidentally struck it full range of motion left thumb joints.  No drainage from nail area.  No abscess.     Assessment & Plan:   Left thumb subungual hematoma  Left medial paronychia  Plan: Soak in warm soapy water for 20 minutes once or twice daily.  Doxycycline 100 mg twice daily for 10 days.  Call if not improving.  Tetanus immunization is up-to-date.

## 2019-07-15 NOTE — Telephone Encounter (Signed)
OK 

## 2019-07-15 NOTE — Telephone Encounter (Signed)
Anna Sanchez [phone]  [name] called to say about a month ago her thumb around the cuticle area was infected on the weekend and her niece gave her a prescription of an antibiotic for a week, it got better, but now it is turning black and tender, she is worried about it becoming infected again. She would like you to look at it. I went ahead and scheduled appointment for today.

## 2019-07-23 NOTE — Patient Instructions (Addendum)
Soak in warm soapy water for 20 minutes once or twice daily.  Take doxycycline 100 mg twice daily for 10 days.  Call if not improving.  Tetanus immunization is up-to-date.

## 2019-08-31 IMAGING — US RIGHT UPPER EXTREMITY SOFT TISSUE ULTRASOUND COMPLETE
1 series · 12 of 12 positions shown · non-contrast
Comparison: None.

CLINICAL DATA: Hand mass.

EXAM:
ULTRASOUND RIGHT UPPER EXTREMITY COMPLETE
TECHNIQUE: Ultrasound examination was performed including evaluation of the
muscles, tendons, joint, and adjacent soft tissues.

[Series 1: right upper extremity soft tissue ultrasound compl · 0.07mm/px · 12 acquisitions, 12 frames shown]
[im 1/12]
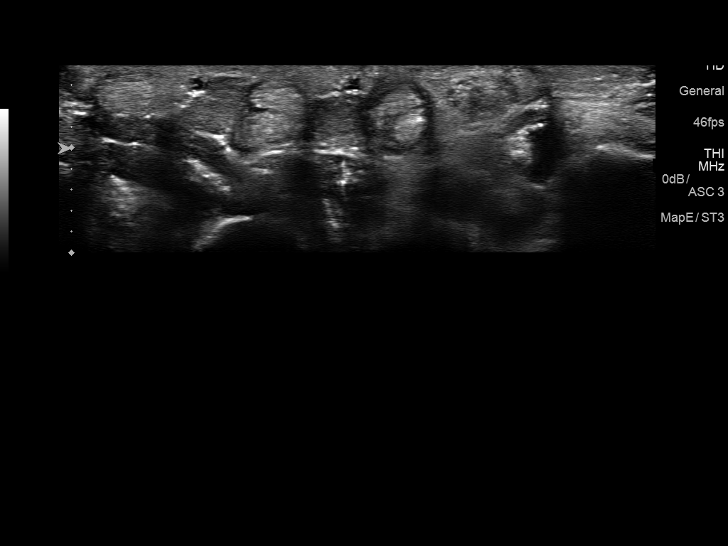
[im 2/12]
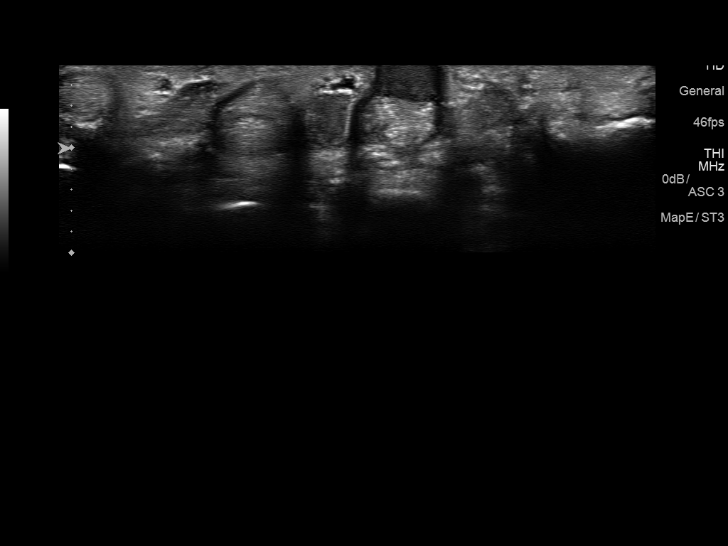
[im 3/12]
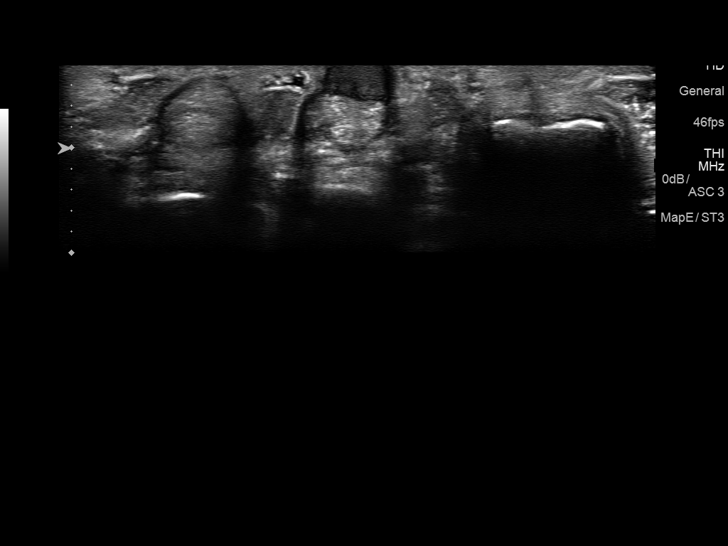
[im 4/12]
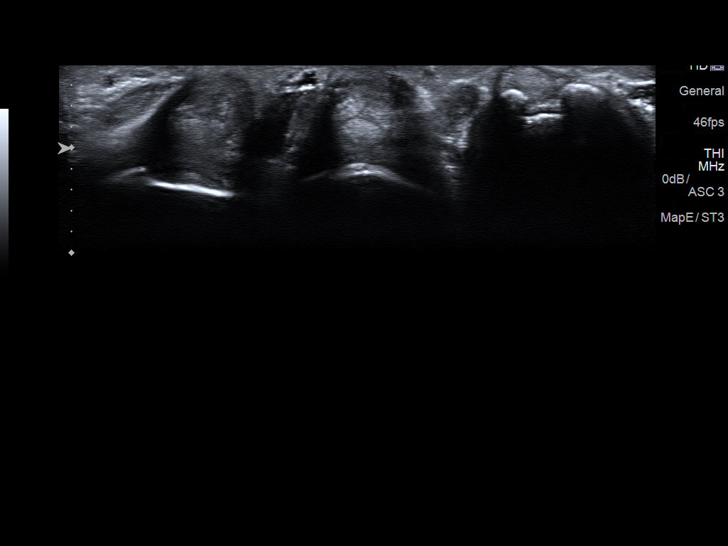
[im 5/12]
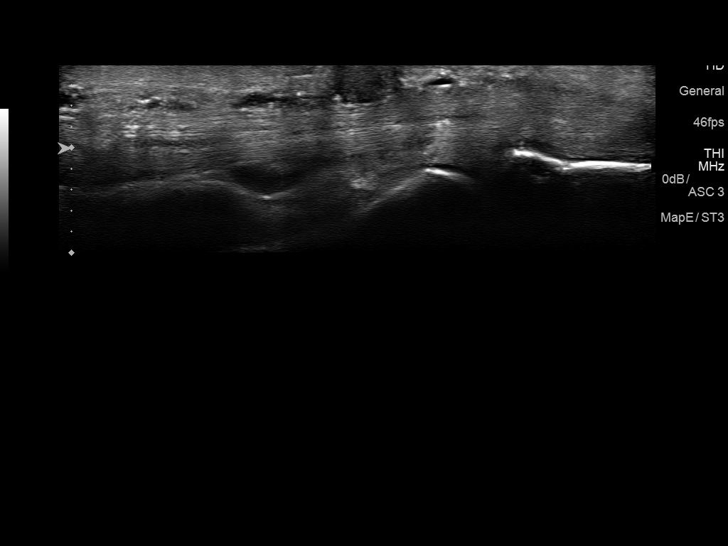
[im 6/12]
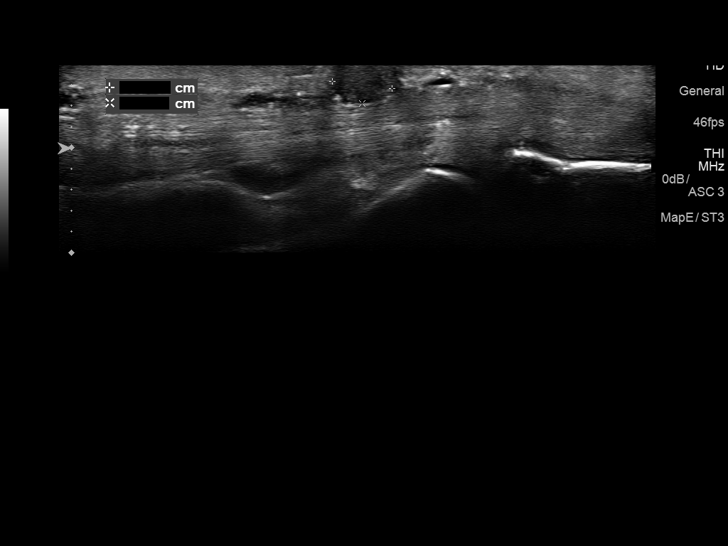
[im 7/12]
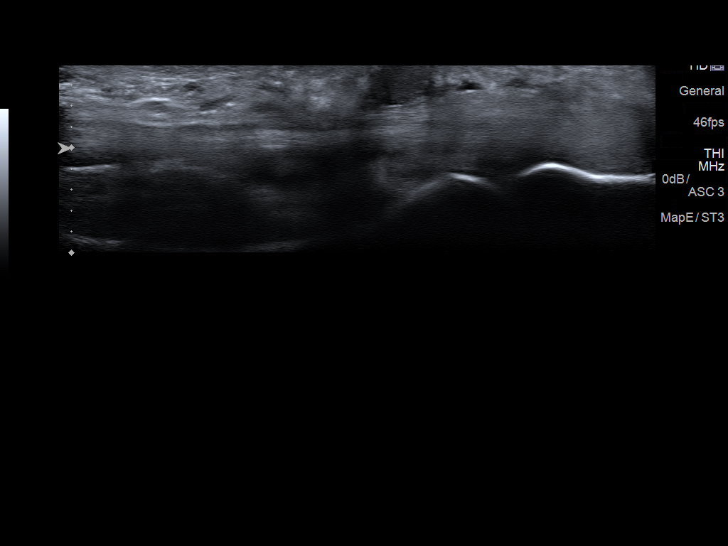
[im 8/12]
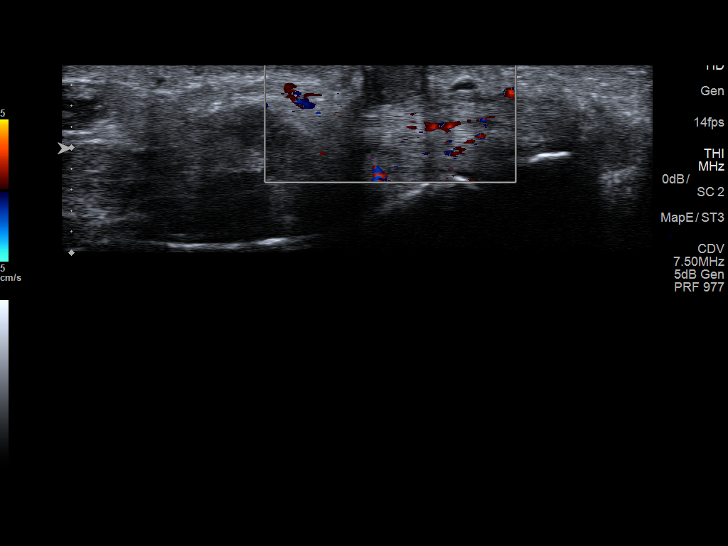
[im 9/12]
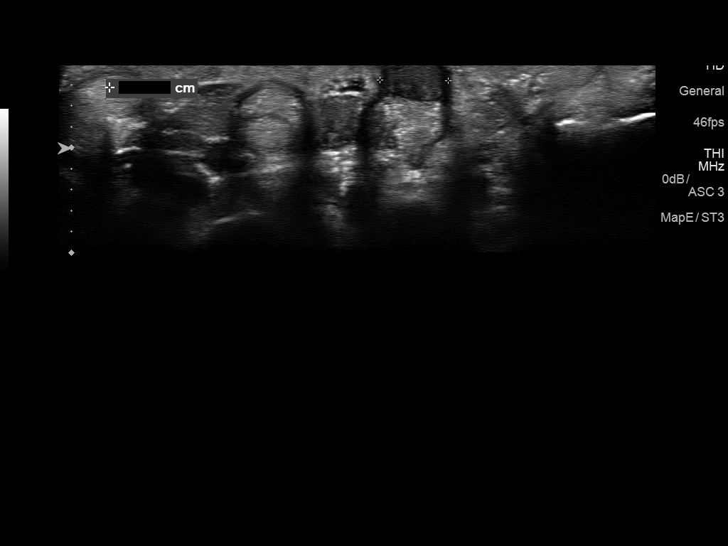
[im 10/12]
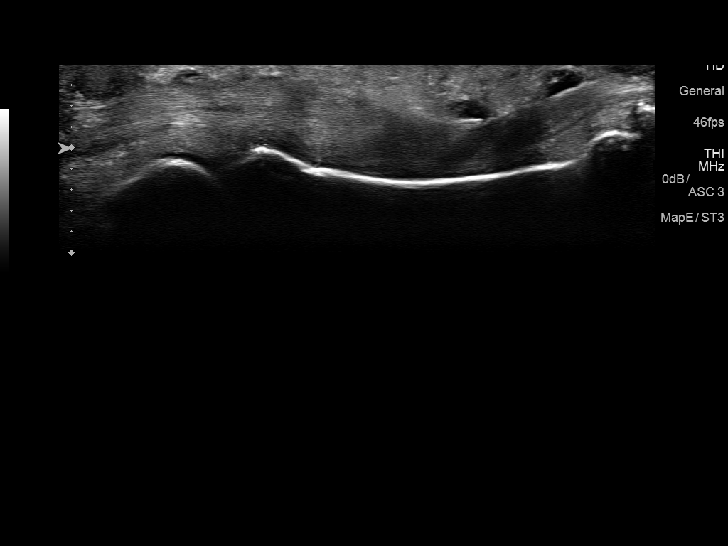
[im 11/12]
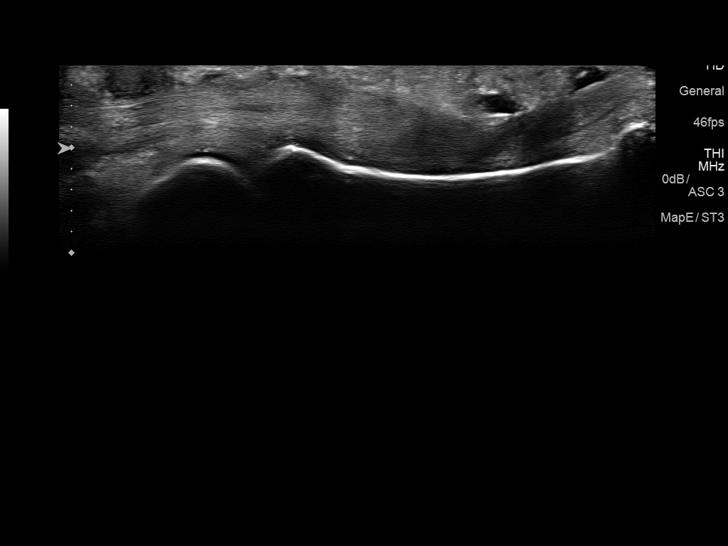
[im 12/12]
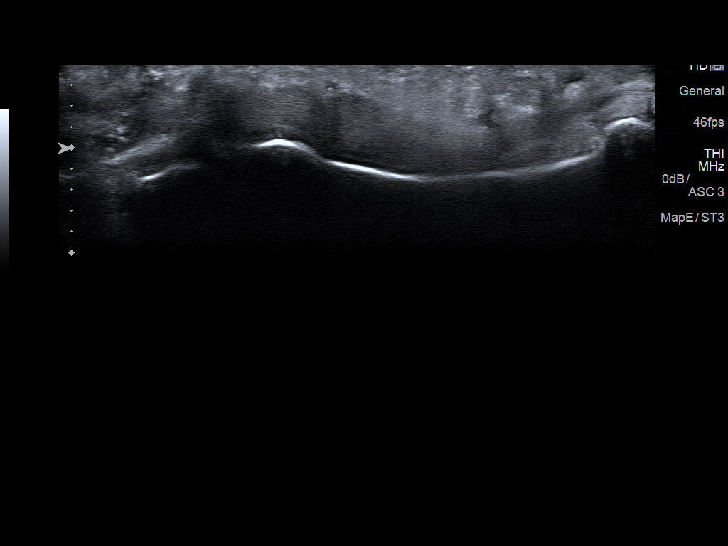

[12 of 12 positions shown; findings below may reference images not displayed]

FINDINGS: There is a well-defined 7 x 6 x 4 mm mass superficial to the flexor
tendon right ring finger just proximal to the MCP joint. The
underlying tendon appears normal. Real-time imaging demonstrates
that the tendon moves smoothly under the mass.

Color flow Doppler imaging demonstrates no abnormal vascularity of
the mass.

The visualized portion of the fourth MCP joint appears normal with
no osteophytes or joint effusion.
IMPRESSION: Well-defined solid mass most likely a giant cell tumor of the tendon
sheath of the flexor tendons of right ring finger.

## 2019-12-08 DIAGNOSIS — M65331 Trigger finger, right middle finger: Secondary | ICD-10-CM | POA: Diagnosis not present

## 2019-12-08 DIAGNOSIS — R2231 Localized swelling, mass and lump, right upper limb: Secondary | ICD-10-CM | POA: Diagnosis not present

## 2020-03-23 ENCOUNTER — Other Ambulatory Visit: Payer: Self-pay

## 2020-03-23 ENCOUNTER — Ambulatory Visit (INDEPENDENT_AMBULATORY_CARE_PROVIDER_SITE_OTHER): Payer: Medicare HMO | Admitting: Internal Medicine

## 2020-03-23 ENCOUNTER — Encounter: Payer: Self-pay | Admitting: Internal Medicine

## 2020-03-23 VITALS — BP 110/80 | HR 80 | Ht 67.0 in | Wt 207.0 lb

## 2020-03-23 DIAGNOSIS — R350 Frequency of micturition: Secondary | ICD-10-CM

## 2020-03-23 DIAGNOSIS — R319 Hematuria, unspecified: Secondary | ICD-10-CM | POA: Diagnosis not present

## 2020-03-23 DIAGNOSIS — R300 Dysuria: Secondary | ICD-10-CM | POA: Diagnosis not present

## 2020-03-23 DIAGNOSIS — N342 Other urethritis: Secondary | ICD-10-CM | POA: Diagnosis not present

## 2020-03-23 LAB — POCT URINALYSIS DIPSTICK
Appearance: NEGATIVE
Bilirubin, UA: NEGATIVE
Glucose, UA: NEGATIVE
Ketones, UA: NEGATIVE
Leukocytes, UA: NEGATIVE
Nitrite, UA: NEGATIVE
Odor: NEGATIVE
Protein, UA: NEGATIVE
Spec Grav, UA: 1.015 (ref 1.010–1.025)
Urobilinogen, UA: 0.2 E.U./dL
pH, UA: 6.5 (ref 5.0–8.0)

## 2020-03-23 MED ORDER — CIPROFLOXACIN HCL 250 MG PO TABS: 250.0000 mg | ORAL_TABLET | Freq: Two times a day (BID) | ORAL | 0 refills | Status: DC

## 2020-03-23 NOTE — Progress Notes (Signed)
   Subjective:    Patient ID: Anna Sanchez, female    DOB: 06-Dec-1950, 69 y.o.   MRN: [id number]  HPI Onset pressure in genital area about 3 am that started 2 nights ago and occurred last evening as well.  She  thinks she may have a UTI.   She had hemorrhagic cystitis in 2019 treated with Cipro. Culture grew E. Coli sensitive to Cipro at that time.    Review of Systems no nausea, vomiting, or back pain.  No hematuria.     Objective:   Physical Exam  Blood pressure 110/80, pulse 80, pulse oximetry 97% weight 207 pounds.  She is afebrile.  No CVA tenderness. Urine dipstick shows trace occult blood otherwise negative.  Urine culture was sent.     Assessment & Plan:  Genital area of pressure for 2 nights in a row.  Culture only shows trace occult blood otherwise negative.  No LE or nitrite.  Culture sent.  Plan: Because of her concerning symptoms we have started on Cipro 250 mg twice daily for 7 days for presumed urethritis versus UTI.  Addendum: Culture shows less than 10,000 colony-forming units per milliliter of single gram-negative organism isolated.  No further testing performed.  Recommend patient finish course of Cipro and call if symptoms do not improve.

## 2020-03-24 LAB — URINE CULTURE
MICRO NUMBER:: 10657860
SPECIMEN QUALITY:: ADEQUATE

## 2020-03-24 NOTE — Patient Instructions (Addendum)
Take Cipro 250 mg twice daily x 7 days pending urine culture results.

## 2020-04-03 DIAGNOSIS — H524 Presbyopia: Secondary | ICD-10-CM | POA: Diagnosis not present

## 2020-06-06 ENCOUNTER — Other Ambulatory Visit: Payer: Self-pay

## 2020-06-06 ENCOUNTER — Other Ambulatory Visit (INDEPENDENT_AMBULATORY_CARE_PROVIDER_SITE_OTHER): Payer: Medicare HMO | Admitting: Internal Medicine

## 2020-06-06 DIAGNOSIS — M8580 Other specified disorders of bone density and structure, unspecified site: Secondary | ICD-10-CM

## 2020-06-06 DIAGNOSIS — E785 Hyperlipidemia, unspecified: Secondary | ICD-10-CM

## 2020-06-06 DIAGNOSIS — I10 Essential (primary) hypertension: Secondary | ICD-10-CM | POA: Diagnosis not present

## 2020-06-06 DIAGNOSIS — Z0000 Encounter for general adult medical examination without abnormal findings: Secondary | ICD-10-CM

## 2020-06-06 DIAGNOSIS — E7800 Pure hypercholesterolemia, unspecified: Secondary | ICD-10-CM

## 2020-06-07 LAB — CBC WITH DIFFERENTIAL/PLATELET
Absolute Monocytes: 521 cells/uL (ref 200–950)
Basophils Absolute: 73 cells/uL (ref 0–200)
Basophils Relative: 1.1 %
Eosinophils Absolute: 178 cells/uL (ref 15–500)
Eosinophils Relative: 2.7 %
HCT: 43.3 % (ref 35.0–45.0)
Hemoglobin: 14.3 g/dL (ref 11.7–15.5)
Lymphs Abs: 1445 cells/uL (ref 850–3900)
MCH: 31.6 pg (ref 27.0–33.0)
MCHC: 33 g/dL (ref 32.0–36.0)
MCV: 95.6 fL (ref 80.0–100.0)
MPV: 12.2 fL (ref 7.5–12.5)
Monocytes Relative: 7.9 %
Neutro Abs: 4382 cells/uL (ref 1500–7800)
Neutrophils Relative %: 66.4 %
Platelets: 206 10*3/uL (ref 140–400)
RBC: 4.53 10*6/uL (ref 3.80–5.10)
RDW: 11.8 % (ref 11.0–15.0)
Total Lymphocyte: 21.9 %
WBC: 6.6 10*3/uL (ref 3.8–10.8)

## 2020-06-07 LAB — COMPLETE METABOLIC PANEL WITH GFR
AG Ratio: 2 (calc) (ref 1.0–2.5)
ALT: 22 U/L (ref 6–29)
AST: 24 U/L (ref 10–35)
Albumin: 4.4 g/dL (ref 3.6–5.1)
Alkaline phosphatase (APISO): 66 U/L (ref 37–153)
BUN: 12 mg/dL (ref 7–25)
CO2: 28 mmol/L (ref 20–32)
Calcium: 9.7 mg/dL (ref 8.6–10.4)
Chloride: 106 mmol/L (ref 98–110)
Creat: 0.8 mg/dL (ref 0.60–0.93)
GFR, Est African American: 87 mL/min/{1.73_m2} (ref >=60)
GFR, Est Non African American: 75 mL/min/{1.73_m2} (ref >=60)
Globulin: 2.2 g/dL (calc) (ref 1.9–3.7)
Glucose, Bld: 102 mg/dL — ABNORMAL HIGH (ref 65–99)
Potassium: 5.3 mmol/L (ref 3.5–5.3)
Sodium: 144 mmol/L (ref 135–146)
Total Bilirubin: 0.7 mg/dL (ref 0.2–1.2)
Total Protein: 6.6 g/dL (ref 6.1–8.1)

## 2020-06-07 LAB — LIPID PANEL
Cholesterol: 208 mg/dL — ABNORMAL HIGH (ref <200)
HDL: 67 mg/dL (ref >=50)
LDL Cholesterol (Calc): 116 mg/dL (calc) — ABNORMAL HIGH
Non-HDL Cholesterol (Calc): 141 mg/dL (calc) — ABNORMAL HIGH (ref <130)
Total CHOL/HDL Ratio: 3.1 (calc) (ref <5.0)
Triglycerides: 136 mg/dL (ref <150)

## 2020-06-07 LAB — TSH: TSH: 2.55 mIU/L (ref 0.40–4.50)

## 2020-06-08 ENCOUNTER — Encounter: Payer: Self-pay | Admitting: Internal Medicine

## 2020-06-08 ENCOUNTER — Ambulatory Visit (INDEPENDENT_AMBULATORY_CARE_PROVIDER_SITE_OTHER): Payer: Medicare HMO | Admitting: Internal Medicine

## 2020-06-08 ENCOUNTER — Other Ambulatory Visit: Payer: Self-pay

## 2020-06-08 VITALS — BP 120/80 | HR 88 | Temp 98.1°F | Ht 67.0 in | Wt 210.0 lb

## 2020-06-08 DIAGNOSIS — Z0000 Encounter for general adult medical examination without abnormal findings: Secondary | ICD-10-CM | POA: Diagnosis not present

## 2020-06-08 DIAGNOSIS — M1711 Unilateral primary osteoarthritis, right knee: Secondary | ICD-10-CM | POA: Diagnosis not present

## 2020-06-08 DIAGNOSIS — R197 Diarrhea, unspecified: Secondary | ICD-10-CM | POA: Diagnosis not present

## 2020-06-08 NOTE — Patient Instructions (Addendum)
We will reschedule your physical exam at a later date when you are asymptomatic and afebrile.  COVID-19 test obtained and results are pending.  Stay with clear liquids for diarrhea and advance diet slowly.  Please see orthopedist when well for osteoarthritis of right knee.  Medicare wellness visit performed today.

## 2020-06-08 NOTE — Progress Notes (Addendum)
Subjective:    Patient ID: Anna Sanchez, female    DOB: [birth year], 69 y.o.   MRN: [id number]  HPI  69 year old Female was here today for Medicare wellness, health maintenance exam and evaluation of medical issues but mentioned she was having issues with diarrhea over the past several days.  Denied fever.  Explained that we were not letting patients with infections symptoms into the office due to the COVID-19 pandemic.  A COVID-19 test was obtained today.  We did proceed with Medicare wellness visit.  Her health maintenance exam was postponed.  Her labs were reviewed with her briefly and are within normal limits.  Review of Systems complaining of some right knee pain.-Recommended orthopedist for her.     Objective:   Physical Exam  Afebrile in the office today.      Assessment & Plan:  Diarrhea-?  Viral rule out COVID-19  Right knee osteoarthritis-see orthopedist when she is asymptomatic from diarrhea i.e. 7 to 10 days.  COVID-19 test is pending.  Plan: We will reschedule her health maintenance exam at another time.  She has a copy of her labs.  Subjective:   Patient presents for Medicare Annual/Subsequent preventive examination.  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 No  Over the past two weeks, have you felt little interest or pleasure in doing things? No Have you lost interest or pleasure in daily life? No Do you often feel hopeless? No Do you cry easily over simple problems? No   Activities of Daily Living  In your present state of health, do you have  any difficulty performing the following activities?:   Driving? No  Managing money? No  Feeding yourself? No  Getting from bed to chair? No  Climbing a flight of stairs? No  Preparing food and eating?: No  Bathing or showering? No  Getting dressed: No  Getting to the toilet? No  Using the toilet:No  Moving around from place to place: No  In the past year have you fallen or had a near fall?:No  Are you sexually active? No  Do you have more than one partner? No   Hearing Difficulties: No  Do you often ask people to speak up or repeat themselves?  Yes Do you experience ringing or noises in your ears? No  Do you have difficulty understanding soft or whispered voices?  Yes Do you feel that you have a problem with memory? No Do you often misplace items? No    Home Safety:  Do you have a smoke alarm at your residence? Yes Do you have grab bars in the bathroom?  No Do you have throw rugs in your house?  Yes   Cognitive Testing  Alert? Yes Normal Appearance?Yes  Oriented to person? Yes Place? Yes  Time? Yes  Recall of three objects? Yes  Can perform simple calculations? Yes  Displays appropriate judgment?Yes  Can read the correct time from a watch face?Yes   List the Names of Other Physician/Practitioners you currently use:  See referral list for the physicians patient is currently seeing.     Review  of Systems: See above   Objective:     General appearance: Appears stated age  Head: Normocephalic, without obvious abnormality, atraumatic  Eyes: conj clear, EOMi PEERLA  Ears: normal TM's and external ear canals both ears  Nose: Nares normal. Septum midline. Mucosa normal. No drainage or sinus tenderness.  Throat: lips, mucosa, and tongue normal; teeth and gums normal  Neck: no adenopathy, no carotid bruit, no JVD, supple, symmetrical, trachea midline and thyroid not enlarged, symmetric, no tenderness/mass/nodules  No CVA tenderness.  Lungs: clear to auscultation  bilaterally  Breasts: normal appearance, no masses or tenderness  Heart: regular rate and rhythm, S1, S2 normal, no murmur, click, rub or gallop  Abdomen: soft, non-tender; bowel sounds normal; no masses, no organomegaly  Musculoskeletal: ROM normal in all joints, no crepitus, no deformity, Normal muscle strengthen. Back  is symmetric, no curvature. Skin: Skin color, texture, turgor normal. No rashes or lesions  Lymph nodes: Cervical, supraclavicular, and axillary nodes normal.  Neurologic: CN 2 -12 Normal, Normal symmetric reflexes. Normal coordination and gait  Psych: Alert & Oriented x 3, Mood appear stable.    Assessment:    Annual wellness medicare exam   Plan:    During the course of the visit the patient was educated and counseled about appropriate screening and preventive services including:   She has had 2 COVID-19 vaccines in March of this year.  Has had Prevnar 13 and pneumococcal 23.  Tetanus immunization is up-to-date.     Patient Instructions (the written plan) was given to the patient.  Medicare Attestation  I have personally reviewed:  The patient's medical and social history  Their use of alcohol, tobacco or illicit drugs  Their current medications and supplements  The patient's functional ability including ADLs,fall risks, home safety risks, cognitive, and hearing and visual impairment  Diet and physical activities  Evidence for depression or mood disorders  The patient's weight, height, BMI, and visual acuity have been recorded in the chart. I have made referrals, counseling, and provided education to the patient based on review of the above and I have provided the patient with a written personalized care plan for preventive services.

## 2020-06-09 LAB — SARS-COV-2 RNA,(COVID-19) QUALITATIVE NAAT: SARS CoV2 RNA: NOT DETECTED

## 2020-06-17 NOTE — Addendum Note (Signed)
Addended by: Elby Showers on: 06/17/2020 06:43 PM   Modules accepted: Level of Service

## 2020-07-31 DIAGNOSIS — Z20822 Contact with and (suspected) exposure to covid-19: Secondary | ICD-10-CM | POA: Diagnosis not present

## 2020-12-05 ENCOUNTER — Ambulatory Visit: Payer: Medicare HMO | Admitting: Physician Assistant

## 2020-12-11 ENCOUNTER — Ambulatory Visit: Payer: Self-pay

## 2020-12-11 ENCOUNTER — Ambulatory Visit: Payer: Medicare HMO | Admitting: Physician Assistant

## 2020-12-11 ENCOUNTER — Ambulatory Visit (INDEPENDENT_AMBULATORY_CARE_PROVIDER_SITE_OTHER): Payer: Medicare HMO

## 2020-12-11 ENCOUNTER — Encounter: Payer: Self-pay | Admitting: Physician Assistant

## 2020-12-11 DIAGNOSIS — M79605 Pain in left leg: Secondary | ICD-10-CM | POA: Diagnosis not present

## 2020-12-11 DIAGNOSIS — M25561 Pain in right knee: Secondary | ICD-10-CM

## 2020-12-11 DIAGNOSIS — M25562 Pain in left knee: Secondary | ICD-10-CM

## 2020-12-11 DIAGNOSIS — G8929 Other chronic pain: Secondary | ICD-10-CM | POA: Diagnosis not present

## 2020-12-11 MED ORDER — CYCLOBENZAPRINE HCL 5 MG PO TABS: 10.0000 mg | ORAL_TABLET | Freq: Three times a day (TID) | ORAL | 0 refills | Status: DC | PRN

## 2020-12-11 NOTE — Progress Notes (Signed)
Office Visit Note   Patient: Anna Sanchez           Date of Birth: 01-18-1951           MRN: [id number] Visit Date: 12/11/2020              Requested by: Elby Showers, MD [address] [place],  [place] [address] PCP: Elby Showers, MD   Assessment & Plan: Visit Diagnoses:  1. Pain in left leg   2. Chronic pain of left knee   3. Chronic pain of right knee     Plan: Given the fact that she trending towards improvement recommend physical therapy and muscle relaxant at night.  Did write a prescription for physical therapy for back to include back exercises, home exercise program, and stretching and modalities.  She has a therapist she is working with that she can give the prescription to.  We will see her back in 6 weeks.   Follow-Up Instructions: Return in about 6 weeks (around 01/22/2021).   Orders:  Orders Placed This Encounter  Procedures  . XR Lumbar Spine 2-3 Views  . XR Knee 1-2 Views Left  . XR Knee 1-2 Views Right   Meds ordered this encounter  Medications  . cyclobenzaprine (FLEXERIL) 5 MG tablet    Sig: Take 2 tablets (10 mg total) by mouth 3 (three) times daily as needed for muscle spasms.    Dispense:  20 tablet    Refill:  0      Procedures: No procedures performed   Clinical Data: No additional findings.   Subjective: Chief Complaint  Patient presents with  . Right Knee - Pain  . Left Knee - Pain  . Left Leg - Pain    HPI Anna Sanchez is a 70 year old female were seen for the first time for back pain and bilateral leg pain left greater than right.  She had no known injury.  However she does state that this all began after bending.  She is having pain particularly on the left that starts lateral thigh down to the anterior aspect of mid tibia.  Pain awakens her.  She denies any bowel bladder dysfunction denies any saddle anesthesia like symptoms.  Denies any numbness tingling down either leg.  She states that she had back pain a few weeks  ago but this is resolved with the help of a massage therapist.  She has been taking ibuprofen.  She also uses turmeric, CBD cream and aspirin.  Pain is worse with standing. Review of Systems See HPI  Objective: Vital Signs: There were no vitals taken for this visit.  Physical Exam Constitutional:      Appearance: She is not ill-appearing or diaphoretic.  Cardiovascular:     Pulses: Normal pulses.  Pulmonary:     Effort: Pulmonary effort is normal.  Neurological:     Mental Status: She is alert and oriented to person, place, and time.  Psychiatric:        Mood and Affect: Mood normal.     Ortho Exam Lower extremity she has 5 out of 5 strength lower extremities against resistance.  Negative straight leg raise bilaterally.  Deep tendon reflexes are 2+ at knees and ankles and symmetric.  Sensation grossly intact bilateral feet.  Tenderness in the paraspinous region lower lumbar bilaterally.  She has excellent flexion extension lumbar spine without pain.  Bilateral knees good range of motion both knees.  She has minimal tenderness over the medial lateral  joint line of both knees but maximal tenderness over the medial joint line of the right knee.  No instability valgus varus stressing of either knee.  No abnormal warmth erythema or effusion of either knee. Specialty Comments:  No specialty comments available.  Imaging: XR Knee 1-2 Views Left  Result Date: 12/11/2020 Left knee 2 views: No acute fracture.  Mild narrowing medial joint line.  Patellofemoral joint line with mild to moderate changes.  Lateral joint lines well-preserved.  Knee is well located.  No bony abnormalities otherwise.  XR Knee 1-2 Views Right  Result Date: 12/11/2020 Right knee 2 views: Shows mild patellofemoral changes mild narrowing medial joint line.  Lateral joint lines well-maintained.  Knee is well located.  No acute fractures or bony abnormalities.  XR Lumbar Spine 2-3 Views  Result Date: 12/11/2020 Lumbar  spine 2 views: Shows normal lordotic curvature.  Disc space overall well-maintained.  Lower lumbar facet joint mild to moderate facet joint changes.  No spondylolisthesis.  Arthrosclerosis of the aorta noted.  No acute fractures.    PMFS History: Patient Active Problem List   Diagnosis Date Noted  . Intertrigo 06/20/2019  . Hyperlipidemia 09/22/2011  . Dyslexia 02/22/2011  . Insomnia 02/22/2011  . Osteopenia 02/22/2011  . Dependent edema 02/22/2011  . Essential hypertension, benign 02/22/2011   Past Medical History:  Diagnosis Date  . Dependent edema   . Dyslexia   . Insomnia   . Osteopenia     Family History  Problem Relation Age of Onset  . Diabetes Father   . Alcohol abuse Father   . Pancreatic cancer Sister   . Colon cancer Neg Hx   . Stomach cancer Neg Hx     Past Surgical History:  Procedure Laterality Date  . ABDOMINAL HYSTERECTOMY     Social History   Occupational History  . Not on file  Tobacco Use  . Smoking status: Former Smoker    Packs/day: 1.00    Years: 33.00    Pack years: 33.00    Types: Cigarettes    Quit date: 02/20/2000    Years since quitting: 20.8  . Smokeless tobacco: Former Systems developer    Quit date: 10/12/2010  Substance and Sexual Activity  . Alcohol use: Yes    Alcohol/week: 7.0 standard drinks    Types: 7 Glasses of wine per week    Comment: glass of wine or one beer at dinner   . Drug use: No  . Sexual activity: Never

## 2021-03-06 ENCOUNTER — Other Ambulatory Visit: Payer: Self-pay | Admitting: Internal Medicine

## 2021-03-06 DIAGNOSIS — Z1231 Encounter for screening mammogram for malignant neoplasm of breast: Secondary | ICD-10-CM

## 2021-03-10 ENCOUNTER — Ambulatory Visit
Admission: RE | Admit: 2021-03-10 | Discharge: 2021-03-10 | Disposition: A | Discharge status: Home / Self Care | Payer: Medicare HMO | Source: Ambulatory Visit | Attending: Internal Medicine | Admitting: Internal Medicine

## 2021-03-10 DIAGNOSIS — Z1231 Encounter for screening mammogram for malignant neoplasm of breast: Secondary | ICD-10-CM

## 2021-04-02 ENCOUNTER — Other Ambulatory Visit: Payer: Self-pay

## 2021-04-02 ENCOUNTER — Telehealth: Payer: Self-pay | Admitting: Internal Medicine

## 2021-04-02 ENCOUNTER — Ambulatory Visit (INDEPENDENT_AMBULATORY_CARE_PROVIDER_SITE_OTHER): Payer: Medicare HMO | Admitting: Internal Medicine

## 2021-04-02 ENCOUNTER — Ambulatory Visit
Admission: RE | Admit: 2021-04-02 | Discharge: 2021-04-02 | Disposition: A | Discharge status: Home / Self Care | Payer: Medicare HMO | Source: Ambulatory Visit | Attending: Internal Medicine | Admitting: Internal Medicine

## 2021-04-02 ENCOUNTER — Ambulatory Visit: Payer: Medicare HMO | Admitting: Internal Medicine

## 2021-04-02 ENCOUNTER — Encounter: Payer: Self-pay | Admitting: Internal Medicine

## 2021-04-02 VITALS — BP 130/80 | HR 92 | Temp 98.2°F | Ht 67.0 in

## 2021-04-02 DIAGNOSIS — R350 Frequency of micturition: Secondary | ICD-10-CM

## 2021-04-02 DIAGNOSIS — R300 Dysuria: Secondary | ICD-10-CM

## 2021-04-02 DIAGNOSIS — R140 Abdominal distension (gaseous): Secondary | ICD-10-CM

## 2021-04-02 LAB — HEMOCCULT GUIAC POC 1CARD (OFFICE): Fecal Occult Blood, POC: NEGATIVE

## 2021-04-02 LAB — POCT URINALYSIS DIPSTICK
Appearance: NEGATIVE
Bilirubin, UA: NEGATIVE
Glucose, UA: NEGATIVE
Ketones, UA: NEGATIVE
Leukocytes, UA: NEGATIVE
Nitrite, UA: NEGATIVE
Odor: NEGATIVE
Protein, UA: NEGATIVE
Spec Grav, UA: 1.015 (ref 1.010–1.025)
Urobilinogen, UA: 0.2 E.U./dL
pH, UA: 6 (ref 5.0–8.0)

## 2021-04-02 MED ORDER — CIPROFLOXACIN HCL 500 MG PO TABS: 500.0000 mg | ORAL_TABLET | Freq: Two times a day (BID) | ORAL | 0 refills | Status: AC

## 2021-04-02 NOTE — Progress Notes (Signed)
   Subjective:    Patient ID: Alfonso Ramus, female    DOB: 1951/07/31, 70 y.o.   MRN: [id number]  HPI 70 year old Female seen with suprapubic pressure when urinating.  No frank dysuria.  Also, having some abdominal bloating. BMs have changed with some dietary changes recently.  Denies being constipated but stools look a bit different she says.  Had colonoscopy 2014 with 5 year follow up recommended so she is overdue for colonoscopy at [facility]. In 2014, had one sessile polyp that was a tubular adenoma.    Review of Systems has appetite.  Tries to eat healthy.  Gets plenty of exercise with her gardening.  No fever or chills.  No blood in stool.  No melena.     Objective:   Physical Exam Abdomen appears to be slightly distended.  No hepatosplenomegaly or masses appreciated.  Slight tenderness over suprapubic area.  Dipstick UA shows nonhemolyzed trace occult blood otherwise negative.  Culture was sent.  No CVA tenderness.     Assessment & Plan:  Possible urinary tract infection-take Cipro 500 milligrams twice daily for 3 days pending urine culture results.  Abdominal distention-my feeling is that she may have constipation and will be sent for KUB abdominal film.  Addendum: She is past due for colonoscopy and should consider that in the near future.  The bowel gas pattern is normal and there is small volume of formed stool in the ascending colon and rectum.  Recommend trying a laxative once to see if symptoms improve.

## 2021-04-02 NOTE — Telephone Encounter (Signed)
scheduled

## 2021-04-02 NOTE — Telephone Encounter (Signed)
Anna Sanchez [phone]  [name] called to say she has had bloating for week or too, she has tried metamucil and probiotics has not helped. She also has possible has UTI, stated she feels pressure.

## 2021-04-02 NOTE — Patient Instructions (Addendum)
You need to have follow-up colonoscopy.  This is past due.  Take Cipro 500 mg twice daily for 3 days for suprapubic pressure pending urine culture.  Consider taking a laxative once to see if abdominal bloating improves.  KUB shows no evidence of obstruction in the intestines.

## 2021-04-02 NOTE — Telephone Encounter (Signed)
LVM to CB need to move appointment to this afternoon.

## 2021-04-03 DIAGNOSIS — H52203 Unspecified astigmatism, bilateral: Secondary | ICD-10-CM | POA: Diagnosis not present

## 2021-04-03 DIAGNOSIS — H5202 Hypermetropia, left eye: Secondary | ICD-10-CM | POA: Diagnosis not present

## 2021-04-03 DIAGNOSIS — H524 Presbyopia: Secondary | ICD-10-CM | POA: Diagnosis not present

## 2021-04-03 DIAGNOSIS — H25013 Cortical age-related cataract, bilateral: Secondary | ICD-10-CM | POA: Diagnosis not present

## 2021-04-03 DIAGNOSIS — H2513 Age-related nuclear cataract, bilateral: Secondary | ICD-10-CM | POA: Diagnosis not present

## 2021-04-03 LAB — URINE CULTURE
MICRO NUMBER:: 12103259
Result:: NO GROWTH
SPECIMEN QUALITY:: ADEQUATE

## 2021-06-05 ENCOUNTER — Telehealth: Payer: Self-pay | Admitting: Internal Medicine

## 2021-06-05 NOTE — Telephone Encounter (Signed)
LVM to CB and reschedule CPE for October or November.

## 2021-06-08 ENCOUNTER — Other Ambulatory Visit: Payer: Medicare HMO | Admitting: Internal Medicine

## 2021-06-08 ENCOUNTER — Other Ambulatory Visit: Payer: Self-pay

## 2021-06-08 DIAGNOSIS — Z130 Encounter for screening for diseases of the blood and blood-forming organs and certain disorders involving the immune mechanism: Secondary | ICD-10-CM

## 2021-06-08 DIAGNOSIS — Z136 Encounter for screening for cardiovascular disorders: Secondary | ICD-10-CM | POA: Diagnosis not present

## 2021-06-08 DIAGNOSIS — Z0000 Encounter for general adult medical examination without abnormal findings: Secondary | ICD-10-CM | POA: Diagnosis not present

## 2021-06-08 LAB — CBC WITH DIFFERENTIAL/PLATELET
Absolute Monocytes: 336 cells/uL (ref 200–950)
Basophils Absolute: 67 cells/uL (ref 0–200)
Basophils Relative: 1.2 %
Eosinophils Absolute: 90 cells/uL (ref 15–500)
Eosinophils Relative: 1.6 %
HCT: 42.7 % (ref 35.0–45.0)
Hemoglobin: 14.2 g/dL (ref 11.7–15.5)
Lymphs Abs: 1406 cells/uL (ref 850–3900)
MCH: 31.1 pg (ref 27.0–33.0)
MCHC: 33.3 g/dL (ref 32.0–36.0)
MCV: 93.6 fL (ref 80.0–100.0)
MPV: 12.1 fL (ref 7.5–12.5)
Monocytes Relative: 6 %
Neutro Abs: 3702 cells/uL (ref 1500–7800)
Neutrophils Relative %: 66.1 %
Platelets: 186 10*3/uL (ref 140–400)
RBC: 4.56 10*6/uL (ref 3.80–5.10)
RDW: 12.2 % (ref 11.0–15.0)
Total Lymphocyte: 25.1 %
WBC: 5.6 10*3/uL (ref 3.8–10.8)

## 2021-06-08 LAB — COMPLETE METABOLIC PANEL WITH GFR
AG Ratio: 2 (calc) (ref 1.0–2.5)
ALT: 24 U/L (ref 6–29)
AST: 23 U/L (ref 10–35)
Albumin: 4.4 g/dL (ref 3.6–5.1)
Alkaline phosphatase (APISO): 76 U/L (ref 37–153)
BUN: 17 mg/dL (ref 7–25)
CO2: 30 mmol/L (ref 20–32)
Calcium: 9.1 mg/dL (ref 8.6–10.4)
Chloride: 105 mmol/L (ref 98–110)
Creat: 0.77 mg/dL (ref 0.60–1.00)
Globulin: 2.2 g/dL (calc) (ref 1.9–3.7)
Glucose, Bld: 102 mg/dL — ABNORMAL HIGH (ref 65–99)
Potassium: 4.6 mmol/L (ref 3.5–5.3)
Sodium: 141 mmol/L (ref 135–146)
Total Bilirubin: 0.6 mg/dL (ref 0.2–1.2)
Total Protein: 6.6 g/dL (ref 6.1–8.1)
eGFR: 82 mL/min/{1.73_m2} (ref >=60)

## 2021-06-08 LAB — LIPID PANEL
Cholesterol: 240 mg/dL — ABNORMAL HIGH (ref <200)
HDL: 65 mg/dL (ref >=50)
LDL Cholesterol (Calc): 145 mg/dL (calc) — ABNORMAL HIGH
Non-HDL Cholesterol (Calc): 175 mg/dL (calc) — ABNORMAL HIGH (ref <130)
Total CHOL/HDL Ratio: 3.7 (calc) (ref <5.0)
Triglycerides: 163 mg/dL — ABNORMAL HIGH (ref <150)

## 2021-06-08 LAB — TSH: TSH: 3.3 mIU/L (ref 0.40–4.50)

## 2021-06-11 ENCOUNTER — Encounter: Payer: Medicare HMO | Admitting: Internal Medicine

## 2021-07-11 ENCOUNTER — Encounter: Payer: Self-pay | Admitting: Internal Medicine

## 2021-07-11 ENCOUNTER — Ambulatory Visit (INDEPENDENT_AMBULATORY_CARE_PROVIDER_SITE_OTHER): Payer: Medicare HMO | Admitting: Internal Medicine

## 2021-07-11 ENCOUNTER — Other Ambulatory Visit: Payer: Self-pay

## 2021-07-11 VITALS — BP 138/78 | HR 63 | Temp 97.3°F | Ht 67.0 in

## 2021-07-11 DIAGNOSIS — Z0000 Encounter for general adult medical examination without abnormal findings: Secondary | ICD-10-CM

## 2021-07-11 DIAGNOSIS — Z87898 Personal history of other specified conditions: Secondary | ICD-10-CM | POA: Diagnosis not present

## 2021-07-11 DIAGNOSIS — Z1211 Encounter for screening for malignant neoplasm of colon: Secondary | ICD-10-CM

## 2021-07-11 DIAGNOSIS — Z1213 Encounter for screening for malignant neoplasm of small intestine: Secondary | ICD-10-CM

## 2021-07-11 DIAGNOSIS — Z86010 Personal history of colonic polyps: Secondary | ICD-10-CM | POA: Diagnosis not present

## 2021-07-11 DIAGNOSIS — E039 Hypothyroidism, unspecified: Secondary | ICD-10-CM

## 2021-07-11 DIAGNOSIS — E782 Mixed hyperlipidemia: Secondary | ICD-10-CM

## 2021-07-11 DIAGNOSIS — R609 Edema, unspecified: Secondary | ICD-10-CM | POA: Diagnosis not present

## 2021-07-11 DIAGNOSIS — Z23 Encounter for immunization: Secondary | ICD-10-CM

## 2021-07-11 DIAGNOSIS — I10 Essential (primary) hypertension: Secondary | ICD-10-CM

## 2021-07-11 DIAGNOSIS — M8580 Other specified disorders of bone density and structure, unspecified site: Secondary | ICD-10-CM

## 2021-07-11 DIAGNOSIS — Z8719 Personal history of other diseases of the digestive system: Secondary | ICD-10-CM

## 2021-07-11 LAB — POCT URINALYSIS DIPSTICK
Bilirubin, UA: NEGATIVE
Blood, UA: NEGATIVE
Glucose, UA: NEGATIVE
Ketones, UA: NEGATIVE
Leukocytes, UA: NEGATIVE
Nitrite, UA: NEGATIVE
Protein, UA: NEGATIVE
Spec Grav, UA: 1.01 (ref 1.010–1.025)
Urobilinogen, UA: 0.2 E.U./dL
pH, UA: 6.5 (ref 5.0–8.0)

## 2021-07-11 NOTE — Progress Notes (Signed)
Subjective:   Patient presents for Medicare Annual/Subsequent preventive examination.  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 The documentation on 08/18/21 for the exam, diagnosis, procedures, and orders are all accurate and complete.  well  Cardiac risk factors:advanced age (older than 23 for men, 74 for women), obesity  Depression Screen  (Note: if answer to either of the following is "Yes", a more complete depression screening is indicated)   Over the past two weeks, have you felt down, depressed or hopeless? No Over the past two weeks, have you felt little interest or pleasure in doing things? No Have you lost interest or pleasure in daily life? No Do you often feel hopeless? No Do you cry easily over simple problems? No  Activities of Daily Living  In your present state of health, do you have any difficulty performing the following activities?:   Driving? No Managing money? No Feeding yourself? No Getting from bed to chair?No Climbing a flight of stairs?  No Preparing food and eating?:  No Bathing or showering?   No Getting dressed:  No Getting to the toilet?  No Using the toilet:  No Moving around from place to place:  No In the past year have you fallen or had a near fall?  No Are you sexually active?  No Do you have more than one partner?  No  Hearing Difficulties:    Do you often ask people to speak up or repeat themselves?  Yes Do you experience ringing or noises in your ears?   No Do you have difficulty understanding soft or whispered voices?  Yes Do you feel that you have a problem with memory?  No  Do you often misplace items?  Yes   Home Safety:  Do you have a smoke alarm at your residence? Yes Do you have grab bars in the bathroom?  No Do you have throw rugs in your house?    Yes   Cognitive Testing  Alert? Yes Normal Appearance?Yes  Oriented to person? Yes Place? Yes  Time? Yes  Recall of three objects? Yes  Can perform simple calculations? Yes  Displays appropriate judgment?Yes  Can read the correct time from a watch face?Yes   List the Names of Other Physician/Practitioners you currently use:  See referral list for the physicians patient is currently seeing.    Patient Instructions (the written plan) was given to the patient.  Medicare Attestation  I have personally reviewed:  The patient's medical and social history  Their use of alcohol, tobacco or illicit drugs  Their current medications and supplements  The patient's functional ability including ADLs,fall risks, home safety risks, cognitive, and hearing and visual impairment  Diet and physical activities  Evidence for depression or mood disorders  The patient's weight, height, BMI, and visual acuity have been recorded in the chart. I have made referrals, counseling, and provided education to the patient based on review of the above and I have provided the patient with a written personalized care plan for preventive services.   70 year old Female presents for health maintenance exam and evaluation of medical issues.  She works as a Scientist, clinical (histocompatibility and immunogenetics).  Gets a fair amount of exercise.  Tries to eat healthy and watches her diet.  She is concerned about her lipid panel.  Total cholesterol was 240 and a year ago was  208.  Triglycerides have increased from 136 to 1 63.  LDL cholesterol has increased from 116 to 145  Have suggested a statin medication but she would like to consider coronary calcium scoring and cardiology consultation.  Previously has not wanted to be on statin medication.  She has a history of insomnia, dyslexia, dependent edema and osteopenia.  History of hypertension treated with losartan.  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  Diagnosed with hypothyroidism in  2017 with elevated TSH of 4.45 and now treated with thyroid replacement therapy.  History of irritable bowel syndrome at times.  Past medical history: Left plantar fasciitis in the past.  Total abdominal hysterectomy with BSO in 1990.  Had colonoscopy by Dr. Deatra Ina in 2014.  1 adenomatous polyp removed and 5-year follow-up recommended.  He is past due for follow-up.  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  She quit smoking around 2004.  Previously smoked 1/2 to 1 pack of cigarettes daily for some 33 years.  Occasional social alcohol consumption.  She is a native of [place].  She moved from [place] to [place] in the 1970s and came to [place] in the 1980s.  Family history: Father with history of diabetes but died of cirrhosis of the liver at age 56.  Mother with history of nervous breakdown, suicide, headaches.  Sister with history of suicide in her 66s.  She has 2 sisters living, 1 of whom has had a brain aneurysm and has adult onset diabetes.  1 brother with history of hepatitis C.  1 brother died in a motorcycle accident at age 79.  87 brother with history of coronary artery disease and alcoholism.  1 brother with history of prostate cancer.   Review of systems: Generally feels well with no new complaints  Blood pressure 138/78 pulse 63, she is afebrile pulse oximetry 99%  Skin: Warm and dry.  No cervical adenopathy or thyromegaly.  No carotid bruits.  Chest is clear to auscultation.  Cardiac exam: Regular rate and rhythm without ectopy.  Breasts are without masses.  Abdomen is soft nondistended without hepatosplenomegaly masses or tenderness.  No lower extremity pitting edema.  Neuro: Intact without gross focal deficits  Impression:  Mixed hyperlipidemia-could benefit from statin medication.  Patient wants to have cardiology consultation and coronary calcium scoring.  This will be arranged.  Currently not on statin medication  History of trigger finger right  middle finger seen by Dr. Fredna Dow  Hypothyroidism treated with thyroid replacement medication  Right trigger finger seen by Dr. Fredna Dow in 2020  Status post TAH/BSO  History of irritable bowel syndrome  History of adenomatous colon polyp.  Needs follow-up colonoscopy.  History of hypertension which she is treated and stable  History of insomnia  History of dependent edema  History of osteopenia  Plan: Last bone density study was in 2018 and T score was -1.6.  This needs to be repeated.  Suggested referral for colonoscopy status post history of adenomatous polyp on previous colonoscopy.  Prefers to do Cologuard instead.  This was ordered.  Wants to see cardiologist for cardiac risk evaluation and needs to discuss statin therapy  Flu vaccine given  Plan: Return in 1 year or as needed.

## 2021-08-18 ENCOUNTER — Encounter: Payer: Self-pay | Admitting: Internal Medicine

## 2021-08-18 NOTE — Patient Instructions (Signed)
Cologuard order placed.

## 2021-09-10 ENCOUNTER — Telehealth: Payer: Self-pay

## 2021-09-10 NOTE — Telephone Encounter (Signed)
LMOM. Patient needs to return cologuard.

## 2021-09-20 NOTE — Telephone Encounter (Signed)
Patient has returned call and has stated she will return cologuard.

## 2021-09-20 NOTE — Telephone Encounter (Signed)
Left message for patient to return call to office at [phone].  

## 2021-09-27 DIAGNOSIS — Z1211 Encounter for screening for malignant neoplasm of colon: Secondary | ICD-10-CM | POA: Diagnosis not present

## 2021-10-04 LAB — COLOGUARD: COLOGUARD: NEGATIVE

## 2021-10-15 ENCOUNTER — Ambulatory Visit: Payer: Medicare HMO | Admitting: Interventional Cardiology

## 2021-10-15 ENCOUNTER — Encounter: Payer: Self-pay | Admitting: Interventional Cardiology

## 2021-10-15 ENCOUNTER — Other Ambulatory Visit: Payer: Self-pay

## 2021-10-15 VITALS — BP 152/84 | HR 73 | Ht 68.0 in | Wt 218.0 lb

## 2021-10-15 DIAGNOSIS — E785 Hyperlipidemia, unspecified: Secondary | ICD-10-CM | POA: Diagnosis not present

## 2021-10-15 DIAGNOSIS — Z8249 Family history of ischemic heart disease and other diseases of the circulatory system: Secondary | ICD-10-CM

## 2021-10-15 DIAGNOSIS — E669 Obesity, unspecified: Secondary | ICD-10-CM | POA: Diagnosis not present

## 2021-10-15 DIAGNOSIS — R030 Elevated blood-pressure reading, without diagnosis of hypertension: Secondary | ICD-10-CM

## 2021-10-15 NOTE — Progress Notes (Signed)
Cardiology Office Note   Date:  10/15/2021   ID:  Anna, Sanchez [birth year], MRN [id number]  PCP:  Elby Showers, MD    Chief Complaint  Patient presents with   Establish Care   Interested in Coronary artery calcium score due to hyperlipidemia  Wt Readings from Last 3 Encounters:  10/15/21 218 lb (98.9 kg)  06/08/20 210 lb (95.3 kg)  03/23/20 207 lb (93.9 kg)       History of Present Illness: Anna Sanchez is a 71 y.o. female who is being seen today for the evaluation of coronary artery calcification/hyperlipidemia at the request of Baxley, Cresenciano Lick, MD.   REcords show: " Gets a fair amount of exercise.  Tries to eat healthy and watches her diet.  She is concerned about her lipid panel.  Total cholesterol was 240 and a year ago was 208.  Triglycerides have increased from 136 to 1 63.  LDL cholesterol has increased from 116 to 145  Have suggested a statin medication but she would like to consider coronary calcium scoring and cardiology consultation.  Previously has not wanted to be on statin medication.  "  SHe walks and eats a lot of fruits and vegetable.  Walking decreased when her dog died.  Gardens as a business.   Sleep has been disturbed as well.   Denies : Chest pain. Dizziness. Leg edema. Nitroglycerin use. Orthopnea. Palpitations. Paroxysmal nocturnal dyspnea. Syncope.    Walking limited more by hip pain.   Past Medical History:  Diagnosis Date   Dependent edema    Dyslexia    History of IBS    HTN (hypertension)    Hx of adenomatous polyp of colon    Hyperlipidemia    Hypothyroidism    Insomnia    Osteopenia     Past Surgical History:  Procedure Laterality Date   ABDOMINAL HYSTERECTOMY       Current Outpatient Medications  Medication Sig Dispense Refill   b complex vitamins capsule Take 1 capsule by mouth daily.     cholecalciferol (VITAMIN D3) 25 MCG (1000 UNIT) tablet Take 1,000 Units by mouth daily.     cyclobenzaprine (FLEXERIL)  5 MG tablet Take 2 tablets (10 mg total) by mouth 3 (three) times daily as needed for muscle spasms. 20 tablet 0   diphenhydrAMINE (BENADRYL) 25 mg capsule Take 25 mg by mouth at bedtime as needed.     ibuprofen (ADVIL,MOTRIN) 200 MG tablet Take 200 mg by mouth every 6 (six) hours as needed.       Multiple Vitamin (MULTIVITAMIN) tablet Take 1 tablet by mouth daily.       Probiotic Product (PROBIOTIC DAILY PO) Take by mouth daily.     psyllium (METAMUCIL SMOOTH TEXTURE) 28 % packet Take 1 packet by mouth 2 (two) times daily.     Red Yeast Rice Extract (RED YEAST RICE PO) Take 1 tablet by mouth daily.     TURMERIC PO Take by mouth daily.     No current facility-administered medications for this visit.    Allergies:   Patient has no known allergies.    Social History:  The patient  reports that she quit smoking about 21 years ago. Her smoking use included cigarettes. She has a 33.00 pack-year smoking history. She quit smokeless tobacco use about 11 years ago. She reports current alcohol use of about 7.0 standard drinks per week. She reports that she does not use drugs.   Family History:  The patient's family history includes Alcohol abuse in her father; Diabetes in her father; Liver cancer in her brother; Pancreatic cancer in her sister.    ROS:  Please see the history of present illness.   Otherwise, review of systems are positive for insomnia issues.   All other systems are reviewed and negative.    PHYSICAL EXAM: VS:  BP (!) 152/84    Pulse 73    Ht 5\' 8"  (1.727 m)    Wt 218 lb (98.9 kg)    SpO2 99%    BMI 33.15 kg/m  , BMI Body mass index is 33.15 kg/m. GEN: Well nourished, well developed, in no acute distress HEENT: normal Neck: no JVD, carotid bruits, or masses Cardiac: RRR; no murmurs, rubs, or gallops,no edema  Respiratory:  clear to auscultation bilaterally, normal work of breathing GI: soft, nontender, nondistended, + BS MS: no deformity or atrophy Skin: warm and dry, no  rash Neuro:  Strength and sensation are intact Psych: euthymic mood, full affect   EKG:   The ekg ordered today demonstrates NSR, no ST changes   Recent Labs: 06/08/2021: ALT 24; BUN 17; Creat 0.77; Hemoglobin 14.2; Platelets 186; Potassium 4.6; Sodium 141; TSH 3.30   Lipid Panel    Component Value Date/Time   CHOL 240 (H) 06/08/2021 0932   TRIG 163 (H) 06/08/2021 0932   HDL 65 06/08/2021 0932   CHOLHDL 3.7 06/08/2021 0932   VLDL 22 04/21/2017 1020   LDLCALC 145 (H) 06/08/2021 0932     Other studies Reviewed: Additional studies/ records that were reviewed today with results demonstrating: labs reviewed, [id number].     ASSESSMENT AND PLAN:  Hyperlipidemia: Has been reluctant to take a statin.  She wants to try to manage this with fiber, exercise and healthy lifestyle.  Whole food, plant based diet.   Former smoker: quit at age 64.   Family h/o early CAD:  Brother with CAD.  Given her risk factors, will plan for coronary calcium scoring CT.  She is aware of the cost.  Depending on her calcium score, could consider starting lipid-lowering therapy.  Continue with healthy lifestyle. Obesity: Recent weight gain.  She is working on trying to get the weight off. Elevated BP today.  Low salt diet.  She avoids processed foods.  She is trying to increase activity.  We will give her a few months of her increased activity before trying any medications at this point.   Current medicines are reviewed at length with the patient today.  The patient concerns regarding her medicines were addressed.  The following changes have been made:  No change  Labs/ tests ordered today include:   Orders Placed This Encounter  Procedures   CT CARDIAC SCORING (SELF PAY ONLY)   EKG 12-Lead    Recommend 150 minutes/week of aerobic exercise Low fat, low carb, high fiber diet recommended  Disposition:   FU based on calcium score   Signed, Larae Grooms, MD  10/15/2021 11:50 AM    [facility] [address], [place], [place]  [address] Phone: [phone]; Fax: [phone]

## 2021-10-15 NOTE — Patient Instructions (Signed)
Medication Instructions:  Your physician recommends that you continue on your current medications as directed. Please refer to the Current Medication list given to you today.  *If you need a refill on your cardiac medications before your next appointment, please call your pharmacy*   Lab Work: none If you have labs (blood work) drawn today and your tests are completely normal, you will receive your results only by: [place] (if you have MyChart) OR A paper copy in the mail If you have any lab test that is abnormal or we need to change your treatment, we will call you to review the results.   Testing/Procedures: Dr Irish Lack recommends you have a Calcium Score CT Scan   Follow-Up: At [facility], you and your health needs are our priority.  As part of our continuing mission to provide you with exceptional heart care, we have created designated Provider Care Teams.  These Care Teams include your primary Cardiologist (physician) and Advanced Practice Providers (APPs -  Physician Assistants and Nurse Practitioners) who all work together to provide you with the care you need, when you need it.  We recommend signing up for the patient portal called "MyChart".  Sign up information is provided on this After Visit Summary.  MyChart is used to connect with patients for Virtual Visits (Telemedicine).  Patients are able to view lab/test results, encounter notes, upcoming appointments, etc.  Non-urgent messages can be sent to your provider as well.   To learn more about what you can do with MyChart, go to [email or web address].    Your next appointment:   Based on results  The format for your next appointment:   In Person  Provider:   Larae Grooms, MD     Other Instructions High-Fiber Eating Plan Fiber, also called dietary fiber, is a type of carbohydrate. It is found foods such as fruits, vegetables, whole grains, and beans. A high-fiber diet can have many health benefits.  Your health care provider may recommend a high-fiber diet to help: Prevent constipation. Fiber can make your bowel movements more regular. Lower your cholesterol. Relieve the following conditions: Inflammation of veins in the anus (hemorrhoids). Inflammation of specific areas of the digestive tract (uncomplicated diverticulosis). A problem of the large intestine, also called the colon, that sometimes causes pain and diarrhea (irritable bowel syndrome, or IBS). Prevent overeating as part of a weight-loss plan. Prevent heart disease, type 2 diabetes, and certain cancers. What are tips for following this plan? Reading food labels  Check the nutrition facts label on food products for the amount of dietary fiber. Choose foods that have 5 grams of fiber or more per serving. The goals for recommended daily fiber intake include: Men (age 51 or younger): 34-38 g. Men (over age 58): 28-34 g. Women (age 60 or younger): 25-28 g. Women (over age 55): 22-25 g. Your daily fiber goal is _____________ g. Shopping Choose whole fruits and vegetables instead of processed forms, such as apple juice or applesauce. Choose a wide variety of high-fiber foods such as avocados, lentils, oats, and kidney beans. Read the nutrition facts label of the foods you choose. Be aware of foods with added fiber. These foods often have high sugar and sodium amounts per serving. Cooking Use whole-grain flour for baking and cooking. Cook with brown rice instead of white rice. Meal planning Start the day with a breakfast that is high in fiber, such as a cereal that contains 5 g of fiber or more per serving. Eat breads and  cereals that are made with whole-grain flour instead of refined flour or white flour. Eat brown rice, bulgur wheat, or millet instead of white rice. Use beans in place of meat in soups, salads, and pasta dishes. Be sure that half of the grains you eat each day are whole grains. General information You can  get the recommended daily intake of dietary fiber by: Eating a variety of fruits, vegetables, grains, nuts, and beans. Taking a fiber supplement if you are not able to take in enough fiber in your diet. It is better to get fiber through food than from a supplement. Gradually increase how much fiber you consume. If you increase your intake of dietary fiber too quickly, you may have bloating, cramping, or gas. Drink plenty of water to help you digest fiber. Choose high-fiber snacks, such as berries, raw vegetables, nuts, and popcorn. What foods should I eat? Fruits Berries. Pears. Apples. Oranges. Avocado. Prunes and raisins. Dried figs. Vegetables Sweet potatoes. Spinach. Kale. Artichokes. Cabbage. Broccoli. Cauliflower. Green peas. Carrots. Squash. Grains Whole-grain breads. Multigrain cereal. Oats and oatmeal. Brown rice. Barley. Bulgur wheat. Icard. Quinoa. Bran muffins. Popcorn. Rye wafer crackers. Meats and other proteins Navy beans, kidney beans, and pinto beans. Soybeans. Split peas. Lentils. Nuts and seeds. Dairy Fiber-fortified yogurt. Beverages Fiber-fortified soy milk. Fiber-fortified orange juice. Other foods Fiber bars. The items listed above may not be a complete list of recommended foods and beverages. Contact a dietitian for more information. What foods should I avoid? Fruits Fruit juice. Cooked, strained fruit. Vegetables Fried potatoes. Canned vegetables. Well-cooked vegetables. Grains White bread. Pasta made with refined flour. White rice. Meats and other proteins Fatty cuts of meat. Fried chicken or fried fish. Dairy Milk. Yogurt. Cream cheese. Sour cream. Fats and oils Butters. Beverages Soft drinks. Other foods Cakes and pastries. The items listed above may not be a complete list of foods and beverages to avoid. Talk with your dietitian about what choices are best for you. Summary Fiber is a type of carbohydrate. It is found in foods such as fruits,  vegetables, whole grains, and beans. A high-fiber diet has many benefits. It can help to prevent constipation, lower blood cholesterol, aid weight loss, and reduce your risk of heart disease, diabetes, and certain cancers. Increase your intake of fiber gradually. Increasing fiber too quickly may cause cramping, bloating, and gas. Drink plenty of water while you increase the amount of fiber you consume. The best sources of fiber include whole fruits and vegetables, whole grains, nuts, seeds, and beans. This information is not intended to replace advice given to you by your health care provider. Make sure you discuss any questions you have with your health care provider. Document Revised: 01/13/2020 Document Reviewed: 01/13/2020 Elsevier Patient Education  2022 [employer].

## 2021-11-23 ENCOUNTER — Ambulatory Visit (INDEPENDENT_AMBULATORY_CARE_PROVIDER_SITE_OTHER)
Admission: RE | Admit: 2021-11-23 | Discharge: 2021-11-23 | Disposition: A | Discharge status: Home / Self Care | Payer: Self-pay | Source: Ambulatory Visit | Attending: Interventional Cardiology | Admitting: Interventional Cardiology

## 2021-11-23 ENCOUNTER — Other Ambulatory Visit: Payer: Self-pay

## 2021-11-23 DIAGNOSIS — Z8249 Family history of ischemic heart disease and other diseases of the circulatory system: Secondary | ICD-10-CM

## 2021-11-23 DIAGNOSIS — E785 Hyperlipidemia, unspecified: Secondary | ICD-10-CM

## 2022-04-04 DIAGNOSIS — H25813 Combined forms of age-related cataract, bilateral: Secondary | ICD-10-CM | POA: Diagnosis not present

## 2022-04-04 DIAGNOSIS — H5211 Myopia, right eye: Secondary | ICD-10-CM | POA: Diagnosis not present

## 2022-04-04 DIAGNOSIS — H524 Presbyopia: Secondary | ICD-10-CM | POA: Diagnosis not present

## 2022-04-04 DIAGNOSIS — H52203 Unspecified astigmatism, bilateral: Secondary | ICD-10-CM | POA: Diagnosis not present

## 2022-04-04 DIAGNOSIS — H5202 Hypermetropia, left eye: Secondary | ICD-10-CM | POA: Diagnosis not present

## 2022-05-16 ENCOUNTER — Other Ambulatory Visit: Payer: Self-pay | Admitting: Internal Medicine

## 2022-05-16 DIAGNOSIS — Z1231 Encounter for screening mammogram for malignant neoplasm of breast: Secondary | ICD-10-CM

## 2022-05-21 ENCOUNTER — Ambulatory Visit
Admission: RE | Admit: 2022-05-21 | Discharge: 2022-05-21 | Disposition: A | Discharge status: Home / Self Care | Payer: Medicare HMO | Source: Ambulatory Visit | Attending: Internal Medicine | Admitting: Internal Medicine

## 2022-05-21 DIAGNOSIS — Z1231 Encounter for screening mammogram for malignant neoplasm of breast: Secondary | ICD-10-CM

## 2022-06-19 DIAGNOSIS — L814 Other melanin hyperpigmentation: Secondary | ICD-10-CM | POA: Diagnosis not present

## 2022-06-19 DIAGNOSIS — L918 Other hypertrophic disorders of the skin: Secondary | ICD-10-CM | POA: Diagnosis not present

## 2022-06-19 DIAGNOSIS — D1801 Hemangioma of skin and subcutaneous tissue: Secondary | ICD-10-CM | POA: Diagnosis not present

## 2022-06-19 DIAGNOSIS — D225 Melanocytic nevi of trunk: Secondary | ICD-10-CM | POA: Diagnosis not present

## 2022-06-19 DIAGNOSIS — D2261 Melanocytic nevi of right upper limb, including shoulder: Secondary | ICD-10-CM | POA: Diagnosis not present

## 2022-06-19 DIAGNOSIS — L578 Other skin changes due to chronic exposure to nonionizing radiation: Secondary | ICD-10-CM | POA: Diagnosis not present

## 2022-06-19 DIAGNOSIS — L738 Other specified follicular disorders: Secondary | ICD-10-CM | POA: Diagnosis not present

## 2022-06-19 DIAGNOSIS — L821 Other seborrheic keratosis: Secondary | ICD-10-CM | POA: Diagnosis not present

## 2022-08-05 ENCOUNTER — Other Ambulatory Visit: Payer: Medicare HMO

## 2022-08-05 DIAGNOSIS — I10 Essential (primary) hypertension: Secondary | ICD-10-CM | POA: Diagnosis not present

## 2022-08-05 DIAGNOSIS — E782 Mixed hyperlipidemia: Secondary | ICD-10-CM

## 2022-08-05 DIAGNOSIS — E039 Hypothyroidism, unspecified: Secondary | ICD-10-CM | POA: Diagnosis not present

## 2022-08-05 LAB — COMPLETE METABOLIC PANEL WITH GFR
AG Ratio: 2 (calc) (ref 1.0–2.5)
ALT: 29 U/L (ref 6–29)
AST: 29 U/L (ref 10–35)
Albumin: 4.5 g/dL (ref 3.6–5.1)
Alkaline phosphatase (APISO): 75 U/L (ref 37–153)
BUN: 15 mg/dL (ref 7–25)
CO2: 28 mmol/L (ref 20–32)
Calcium: 9.3 mg/dL (ref 8.6–10.4)
Chloride: 105 mmol/L (ref 98–110)
Creat: 0.71 mg/dL (ref 0.60–1.00)
Globulin: 2.2 g/dL (calc) (ref 1.9–3.7)
Glucose, Bld: 95 mg/dL (ref 65–99)
Potassium: 4.8 mmol/L (ref 3.5–5.3)
Sodium: 140 mmol/L (ref 135–146)
Total Bilirubin: 0.5 mg/dL (ref 0.2–1.2)
Total Protein: 6.7 g/dL (ref 6.1–8.1)
eGFR: 90 mL/min/{1.73_m2} (ref >=60)

## 2022-08-05 LAB — LIPID PANEL
Cholesterol: 197 mg/dL (ref <200)
HDL: 59 mg/dL (ref >=50)
LDL Cholesterol (Calc): 116 mg/dL (calc) — ABNORMAL HIGH
Non-HDL Cholesterol (Calc): 138 mg/dL (calc) — ABNORMAL HIGH (ref <130)
Total CHOL/HDL Ratio: 3.3 (calc) (ref <5.0)
Triglycerides: 108 mg/dL (ref <150)

## 2022-08-05 LAB — CBC WITH DIFFERENTIAL/PLATELET
Absolute Monocytes: 360 cells/uL (ref 200–950)
Basophils Absolute: 81 cells/uL (ref 0–200)
Basophils Relative: 1.4 %
Eosinophils Absolute: 128 cells/uL (ref 15–500)
Eosinophils Relative: 2.2 %
HCT: 42.9 % (ref 35.0–45.0)
Hemoglobin: 14.5 g/dL (ref 11.7–15.5)
Lymphs Abs: 1247 cells/uL (ref 850–3900)
MCH: 31 pg (ref 27.0–33.0)
MCHC: 33.8 g/dL (ref 32.0–36.0)
MCV: 91.9 fL (ref 80.0–100.0)
MPV: 11.9 fL (ref 7.5–12.5)
Monocytes Relative: 6.2 %
Neutro Abs: 3985 cells/uL (ref 1500–7800)
Neutrophils Relative %: 68.7 %
Platelets: 188 10*3/uL (ref 140–400)
RBC: 4.67 10*6/uL (ref 3.80–5.10)
RDW: 12.1 % (ref 11.0–15.0)
Total Lymphocyte: 21.5 %
WBC: 5.8 10*3/uL (ref 3.8–10.8)

## 2022-08-05 LAB — TSH: TSH: 3.02 mIU/L (ref 0.40–4.50)

## 2022-08-09 ENCOUNTER — Ambulatory Visit (INDEPENDENT_AMBULATORY_CARE_PROVIDER_SITE_OTHER): Payer: Medicare HMO | Admitting: Internal Medicine

## 2022-08-09 ENCOUNTER — Encounter: Payer: Self-pay | Admitting: Internal Medicine

## 2022-08-09 VITALS — BP 140/80 | HR 81 | Temp 98.9°F | Ht 67.0 in | Wt 222.4 lb

## 2022-08-09 DIAGNOSIS — M8580 Other specified disorders of bone density and structure, unspecified site: Secondary | ICD-10-CM | POA: Diagnosis not present

## 2022-08-09 DIAGNOSIS — Z8719 Personal history of other diseases of the digestive system: Secondary | ICD-10-CM

## 2022-08-09 DIAGNOSIS — Z0000 Encounter for general adult medical examination without abnormal findings: Secondary | ICD-10-CM

## 2022-08-09 DIAGNOSIS — Z87898 Personal history of other specified conditions: Secondary | ICD-10-CM

## 2022-08-09 DIAGNOSIS — E782 Mixed hyperlipidemia: Secondary | ICD-10-CM

## 2022-08-09 DIAGNOSIS — I10 Essential (primary) hypertension: Secondary | ICD-10-CM | POA: Diagnosis not present

## 2022-08-09 DIAGNOSIS — E039 Hypothyroidism, unspecified: Secondary | ICD-10-CM

## 2022-08-09 DIAGNOSIS — Z7185 Encounter for immunization safety counseling: Secondary | ICD-10-CM | POA: Diagnosis not present

## 2022-08-09 DIAGNOSIS — Z86010 Personal history of colonic polyps: Secondary | ICD-10-CM | POA: Diagnosis not present

## 2022-08-09 DIAGNOSIS — Z23 Encounter for immunization: Secondary | ICD-10-CM

## 2022-08-09 LAB — POCT URINALYSIS DIPSTICK
Bilirubin, UA: NEGATIVE
Glucose, UA: NEGATIVE
Ketones, UA: NEGATIVE
Leukocytes, UA: NEGATIVE
Nitrite, UA: NEGATIVE
Protein, UA: NEGATIVE
Spec Grav, UA: 1.01 (ref 1.010–1.025)
Urobilinogen, UA: 0.2 E.U./dL
pH, UA: 5 (ref 5.0–8.0)

## 2022-08-09 MED ORDER — MELOXICAM 15 MG PO TABS: 15.0000 mg | ORAL_TABLET | Freq: Every day | ORAL | 1 refills | Status: DC

## 2022-08-09 NOTE — Progress Notes (Signed)
Annual Wellness Visit     Patient: Anna Sanchez, Female    DOB: [birth year], 71 y.o.   MRN: [id number] Visit Date: 08/09/2022  Chief Complaint  Patient presents with   Annual Exam   Subjective    Anna Sanchez is a 71 y.o. female who presents today for her Annual Wellness Visit.  HPI She also present for health maintenance exam and evaluation of medical issues.  She works as a Scientist, clinical (histocompatibility and immunogenetics).  She gets a fair amount of exercise.  Tries to eat healthy and watches her diet. She had coronary calcium scoring in March and her total score was 54.5.  On CT of the chest she had a 4 mm fissural nodule in the lateral aspect of the right major fissure.  Had 2.7 simple cyst in the left lobe of the liver.  She had atherosclerosis of the thoracic aorta.  She takes fish oil and has not wanted to be on cholesterol lowering medication.  LDL is currently 116, triglycerides 108, HDL 59 and total cholesterol 197.  This is improved from a year ago.  History of insomnia, dyslexia, dependent edema and osteopenia.  Hypertension treated with losartan but she has discontinued this and blood pressure is elevated today systolic.  She says she will keep an eye on it.  Diagnosed with hypothyroidism in 2017.  Currently not on thyroid replacement medicine and TSH is 3.02.  She has musculoskeletal pain from gardening and is currently on Mobic 15 mg daily as of today.  Has taken Flexeril in the past intermittently and Naprosyn or Aleve.  Tetanus immunization is up-to-date.  Received pneumococcal 20 vaccine today.  No recent COVID vaccines.  Last COVID-vaccine October 2022.  Had high-dose flu vaccine October 2023.  She had colonoscopy by Dr. Deatra Ina in 2014.  Had 1 adenomatous polyp removed and 5-year follow-up recommended.  She is past due for follow-up.  Past medical history: Total abdominal hysterectomy with BSO in 1990.  Left plantar fasciitis in the past.  History of irritable bowel syndrome  intermittently.  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  She quit smoking around 2004.  Previously smoked 1/2 to 1 pack of cigarettes daily for some 33 years.  Occasional social alcohol consumption.  She is a native of [place].  She moved from [place] to [place] in the 1970s and came to [place] in the 1980s.  Family history: Father with history of diabetes but died of cirrhosis of the liver at age 74.  Mother with history of nervous breakdown, headaches and suicide.  Sister with history of suicide in her 35s.  She has 2 sisters living 1 of whom has had a brain aneurysm and has adult onset diabetes.  1 brother with history of hepatitis C.  1 brother died in a motorcycle accident at age 23.  35 brother with history of coronary artery disease and alcoholism.  1 brother with history of prostate cancer.  History of trigger finger right middle finger seen by Dr. Fredna Dow.  History of insomnia, dependent edema and osteopenia.  History of irritable bowel syndrome and mixed hyperlipidemia.  Status post TAH/BSO.       Review of Systems currently feels pretty well with no new complaints   Objective    Vitals: Reviewed  Physical Exam  Skin: Warm and dry.  No cervical adenopathy, thyromegaly or carotid bruits.  Chest is clear.  Cardiac exam: Regular rate and rhythm.  Breast are without masses.  Abdomen  is soft nondistended without hepatosplenomegaly masses or tenderness.  No lower extremity pitting edema.  Declines pelvic exam.  Neuro is intact without gross focal deficits.  Affect thought and judgment are normal.     Most recent fall risk assessment:    07/11/2021   10:08 AM  Fall Risk   Falls in the past year? 0  Number falls in past yr: 0  Injury with Fall? 0  Risk for fall due to : No Fall Risks  Follow up Falls evaluation completed    Most recent depression screenings:    07/11/2021   10:09 AM 06/08/2020   10:01 AM  PHQ 2/9 Scores  PHQ - 2 Score 0 0    Most recent cognitive screening:    07/11/2021   10:10 AM  6CIT Screen  What Year? 0 points  What month? 0 points  What time? 0 points  Count back from 20 0 points  Months in reverse 0 points  Repeat phrase 2 points  Total Score 2 points       Assessment & Plan   History of irritable bowel syndrome  Musculoskeletal pain treated with Mobic  History of hypothyroidism.  Currently not on thyroid replacement medication and TSH is 3.02 and needs to be monitored  History of mixed hyperlipidemia but currently LDL has improved from last year at 1 45-1 16.  Triglycerides have normalized from 163 to 108 and total cholesterol has improved from 2 40-1 97.  CBC and c-Met are normal.  History of insomnia  History of hypertension but currently not on any antihypertensive medication  History of dependent edema  History of osteopenia  History of right trigger finger seen by Dr. Fredna Dow in 2020  Plan: Cologuard was done in January 2023 and was normal.  She declines colonoscopy.  Immunizations discussed.  Medications discussed and she is happy with her present medications.  Return in 1 year or as needed.       Annual wellness visit done today including the all of the following: Reviewed patient's Family Medical History Reviewed and updated list of patient's medical providers Assessment of cognitive impairment was done Assessed patient's functional ability Established a written schedule for health screening [place] Completed and Reviewed  Discussed health benefits of physical activity, and encouraged her to engage in regular exercise appropriate for her age and condition.         {I, Elby Showers, MD, have reviewed all documentation for this visit. The documentation on 08/22/22 for the exam, diagnosis, procedures, and orders are all accurate and complete.   Anna Sanchez, CMA

## 2022-08-09 NOTE — Patient Instructions (Addendum)
Had Cologard January 2023. Pneumococcal vaccine given. Labs have improved. Take meloxicam for knee pain. Have Covid booster.  Return in 1 year or as needed.  It was a pleasure to see you today.

## 2022-10-07 ENCOUNTER — Other Ambulatory Visit: Payer: Self-pay | Admitting: Internal Medicine

## 2022-12-26 ENCOUNTER — Other Ambulatory Visit: Payer: Self-pay | Admitting: Internal Medicine

## 2023-03-04 ENCOUNTER — Other Ambulatory Visit: Payer: Self-pay | Admitting: Internal Medicine

## 2023-07-10 ENCOUNTER — Other Ambulatory Visit: Payer: Self-pay | Admitting: Internal Medicine

## 2023-07-10 DIAGNOSIS — Z1231 Encounter for screening mammogram for malignant neoplasm of breast: Secondary | ICD-10-CM

## 2023-08-07 NOTE — Progress Notes (Addendum)
Annual Wellness Visit    Patient Care Team: Denice Cardon, Luanna Cole, MD as PCP - General Corky Crafts, MD as PCP - Cardiology (Cardiology)  Visit Date: 08/14/23   Chief Complaint  Patient presents with   Medicare Wellness    Subjective:   Patient: Anna Sanchez, Female    DOB: 1950/11/02, 72 y.o.   MRN: [id number]  Anna Sanchez is a 72 y.o. Female who presents today for her Annual Wellness Visit. History of dependent edema, dyslexia, IBS, hypertension, adenomatous colonic polyps, hyperlipidemia, hypothyroidism, insomnia, osteopenia.  She works as a Agricultural engineer.  She gets a fair amount of exercise.  Tries to eat healthy and watches her diet.  She had coronary calcium scoring in March 2023 and her total score was 54.5.  On CT of the chest she had a 4 mm fissural nodule in the lateral aspect of the right major fissure.  Had 2.7 simple cyst in the left lobe of the liver.  She had atherosclerosis of the thoracic aorta.  She takes fish oil and has not wanted to be on cholesterol lowering medication.   History of hyperlipidemia. CHOL elevated at 205, LDL elevated at 120.   History of insomnia, dyslexia, dependent edema and osteopenia.     Diagnosed with hypothyroidism in 2017.  Currently not on thyroid replacement medicine and TSH is 3.0.   She has musculoskeletal pain from gardening and is currently on Mobic 15 mg daily.  Has taken Flexeril in the past intermittently and Naprosyn or Aleve. She has been more sedentary recently due to joint pain and has noticed some weight gain. She has gained 3 pounds since 08/09/22.  History of chronic right and left knee pain.   Past medical history: Total abdominal hysterectomy with BSO in 1990.  Left plantar fasciitis in the past.   History of irritable bowel syndrome intermittently.  History of trigger finger right middle finger seen by Dr. Merlyn Lot.  08/11/23 labs reviewed. Glucose elevated at 101. Kidney, liver functions normal.  Electrolytes normal. Blood proteins normal. CBC normal. ALT elevated at 37. She had a glass of wine the night before her blood draw.  Mammogram normal on 08/08/23.  She had colonoscopy by Dr. Arlyce Dice in 2014. Had 1 adenomatous polyp removed and 5-year follow-up recommended. Cologuard negative on 09/27/21.  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  She quit smoking around 2004.  Previously smoked 1/2 to 1 pack of cigarettes daily for some 33 years.  Occasional social alcohol consumption.  She is a native of [place].  She moved from [place] to [place] in the 1970s and came to [place] in the 1980s.   Family history: Father with history of diabetes but died of cirrhosis of the liver at age 85.  Mother with history of nervous breakdown, headaches and suicide.  Sister with history of suicide in her 30s.  She has 2 sisters living 1 of whom has had a brain aneurysm and has adult onset diabetes.  1 brother with history of hepatitis C.  1 brother died in a motorcycle accident at age 11.  1 brother with history of coronary artery disease and alcoholism.  1 brother with history of prostate cancer.  Past Medical History:  Diagnosis Date   Dependent edema    Dyslexia    History of IBS    HTN (hypertension)    Hx of adenomatous polyp of colon    Hyperlipidemia    Hypothyroidism  Insomnia    Osteopenia      Family History  Problem Relation Age of Onset   Diabetes Father    Alcohol abuse Father    Pancreatic cancer Sister    Liver cancer Brother    Colon cancer Neg Hx    Stomach cancer Neg Hx    BRCA 1/2 Neg Hx    Breast cancer Neg Hx      Social history: She is a self-employed Agricultural engineer.  She gets a lot of exercise.  She is single.  Has a 4-year college degree.  Quit smoking around 2004 and prior to that smoked 1/2 to 1 pack of cigarettes daily for some 33 years.  Very occasional social alcohol consumption.  She is a native of [place].  She moved from [place] to [place]  in the 1970s and came to [place] in the 1980s.  Family history: Father with history of diabetes but died of cirrhosis of the liver at age 39.  Mother with history of nervous breakdown, suicide, headaches.  2 sisters living.  1 of whom has had a brain aneurysm and has adult onset diabetes.  1 brother with history of hepatitis C.  1 brother died in a motorcycle accident at age 21.  1 brother with history of coronary artery disease and alcoholism.  1 brother with history of prostate cancer.    Review of Systems  Constitutional:  Negative for chills, fever, malaise/fatigue and weight loss.  HENT:  Negative for hearing loss, sinus pain and sore throat.   Respiratory:  Negative for cough, hemoptysis and shortness of breath.   Cardiovascular:  Negative for chest pain, palpitations, leg swelling and PND.  Gastrointestinal:  Negative for abdominal pain, constipation, diarrhea, heartburn, nausea and vomiting.  Genitourinary:  Negative for dysuria, frequency and urgency.  Musculoskeletal:  Positive for joint pain. Negative for back pain, myalgias and neck pain.  Skin:  Negative for itching and rash.  Neurological:  Negative for dizziness, tingling, seizures and headaches.  Endo/Heme/Allergies:  Negative for polydipsia.  Psychiatric/Behavioral:  Negative for depression. The patient is not nervous/anxious.       Objective:   Vitals: BP 120/88   Pulse 72   Ht 5\' 7"  (1.702 m)   Wt 225 lb (102.1 kg)   SpO2 97%   BMI 35.24 kg/m   Physical Exam Vitals and nursing note reviewed.  Constitutional:      General: She is not in acute distress.    Appearance: Normal appearance. She is not ill-appearing or toxic-appearing.  HENT:     Head: Normocephalic and atraumatic.     Right Ear: Hearing, tympanic membrane, ear canal and external ear normal.     Left Ear: Hearing, tympanic membrane, ear canal and external ear normal.     Mouth/Throat:     Pharynx: Oropharynx is clear.  Eyes:     Extraocular  Movements: Extraocular movements intact.     Pupils: Pupils are equal, round, and reactive to light.  Neck:     Thyroid: No thyroid mass, thyromegaly or thyroid tenderness.     Vascular: No carotid bruit.  Cardiovascular:     Rate and Rhythm: Normal rate and regular rhythm. No extrasystoles are present.    Pulses:          Dorsalis pedis pulses are 1+ on the right side and 1+ on the left side.     Heart sounds: Normal heart sounds. No murmur heard.    No friction rub. No gallop.  Pulmonary:  Effort: Pulmonary effort is normal.     Breath sounds: Normal breath sounds. No decreased breath sounds, wheezing, rhonchi or rales.  Chest:     Chest wall: No mass.  Breasts:    Right: No mass.     Left: No mass.  Abdominal:     Palpations: Abdomen is soft. There is no hepatomegaly, splenomegaly or mass.     Tenderness: There is no abdominal tenderness.     Hernia: No hernia is present.  Musculoskeletal:     Cervical back: Normal range of motion.     Right lower leg: No edema.     Left lower leg: No edema.  Lymphadenopathy:     Cervical: No cervical adenopathy.     Upper Body:     Right upper body: No supraclavicular adenopathy.     Left upper body: No supraclavicular adenopathy.  Skin:    General: Skin is warm and dry.  Neurological:     General: No focal deficit present.     Mental Status: She is alert and oriented to person, place, and time. Mental status is at baseline.     Sensory: Sensation is intact.     Motor: Motor function is intact. No weakness.     Deep Tendon Reflexes: Reflexes are normal and symmetric.  Psychiatric:        Attention and Perception: Attention normal.        Mood and Affect: Mood normal.        Speech: Speech normal.        Behavior: Behavior normal.        Thought Content: Thought content normal.        Cognition and Memory: Cognition normal.        Judgment: Judgment normal.      Most recent functional status assessment:    08/14/2023    11:11 AM  In your present state of health, do you have any difficulty performing the following activities:  Hearing? 0  Vision? 0  Difficulty concentrating or making decisions? 0  Walking or climbing stairs? 0  Dressing or bathing? 0  Doing errands, shopping? 0  Preparing Food and eating ? N  Using the Toilet? N  In the past six months, have you accidently leaked urine? N  Do you have problems with loss of bowel control? N  Managing your Medications? N  Managing your Finances? N  Housekeeping or managing your Housekeeping? N   Most recent fall risk assessment:    08/14/2023   11:13 AM  Fall Risk   Falls in the past year? 0  Number falls in past yr: 0  Injury with Fall? 0  Risk for fall due to : No Fall Risks  Follow up Falls evaluation completed;Education provided;Falls prevention discussed    Most recent depression screenings:    08/14/2023   11:13 AM 08/09/2022   11:07 AM  PHQ 2/9 Scores  PHQ - 2 Score 1 0   Most recent cognitive screening:    08/14/2023   11:15 AM  6CIT Screen  What Year? 0 points  What month? 0 points  What time? 0 points  Count back from 20 0 points  Months in reverse 0 points  Repeat phrase 0 points  Total Score 0 points     Results:   Studies obtained and personally reviewed by me:  Mammogram normal on 08/08/23.  She had colonoscopy by Dr. Arlyce Dice in 2014. Had 1 adenomatous polyp removed and 5-year follow-up recommended. Cologuard  negative on 09/27/21.  She had coronary calcium scoring in March 2023 and her total score was 54.5.   Labs:       Component Value Date/Time   NA 139 08/11/2023 0908   K 4.4 08/11/2023 0908   CL 101 08/11/2023 0908   CO2 32 08/11/2023 0908   GLUCOSE 101 (H) 08/11/2023 0908   BUN 17 08/11/2023 0908   CREATININE 0.72 08/11/2023 0908   CALCIUM 9.3 08/11/2023 0908   PROT 6.7 08/11/2023 0908   ALBUMIN 4.6 04/21/2017 1020   AST 30 08/11/2023 0908   ALT 37 (H) 08/11/2023 0908   ALKPHOS 61 04/21/2017  1020   BILITOT 0.6 08/11/2023 0908   GFRNONAA 75 06/06/2020 0908   GFRAA 87 06/06/2020 0908     Lab Results  Component Value Date   WBC 5.2 08/11/2023   HGB 14.0 08/11/2023   HCT 42.6 08/11/2023   MCV 93.4 08/11/2023   PLT 185 08/11/2023    Lab Results  Component Value Date   CHOL 205 (H) 08/11/2023   HDL 61 08/11/2023   LDLCALC 120 (H) 08/11/2023   TRIG 126 08/11/2023   CHOLHDL 3.4 08/11/2023    No results found for: "HGBA1C"   Lab Results  Component Value Date   TSH 3.00 08/11/2023    Assessment & Plan:   Mixed hyperlipidemia: diet and exercise controlled. CHOL elevated at 205, LDL elevated at 120.  Does not want to be on statin medication.  Coronary calcium scoring was 54.5  Musculoskeletal pain: from gardening and is currently on Mobic 15 mg daily.  History of mild hypertension her blood pressure is normal today and she takes no medication.  Used to take losartan.  His blood pressure has been normal at home.  Hx of hypothyroidism not currently on replacement hormone and TSH at 3.0.  Urinalysis normal today.  Mammogram normal on 08/08/23.  Osteopenia-no recent bone density study on file.  Last one showed T-score of -1.6 in the forearm radius in 2018.  She gets plenty of weightbearing exercise and does not want to be on bone sparing sparing therapy  She had colonoscopy by Dr. Arlyce Dice in 2014. Had 1 adenomatous polyp removed and 5-year follow-up recommended. Cologuard negative on 09/27/21.  She had coronary calcium scoring in March 2023 and her total score was 54.5.   Vaccine counseling: UTD on tetanus, pneumococcal-20 vaccines. Declines RSV vaccine.  Return in 1 year or as needed.     Annual wellness visit done today including the all of the following: Reviewed patient's Family Medical History Reviewed and updated list of patient's medical providers Assessment of cognitive impairment was done Assessed patient's functional ability Established a written  schedule for health screening services Health Risk Assessent Completed and Reviewed  Discussed health benefits of physical activity, and encouraged her to engage in regular exercise appropriate for her age and condition.        I,Alexander Ruley,acting as a Neurosurgeon for Margaree Mackintosh, MD.,have documented all relevant documentation on the behalf of Margaree Mackintosh, MD,as directed by  Margaree Mackintosh, MD while in the presence of Margaree Mackintosh, MD.   I, Margaree Mackintosh, MD, have reviewed all documentation for this visit. The documentation on 08/23/23 for the exam, diagnosis, procedures, and orders are all accurate and complete.

## 2023-08-08 ENCOUNTER — Ambulatory Visit
Admission: RE | Admit: 2023-08-08 | Discharge: 2023-08-08 | Disposition: A | Discharge status: Home / Self Care | Payer: Medicare HMO | Source: Ambulatory Visit | Attending: Internal Medicine | Admitting: Internal Medicine

## 2023-08-08 DIAGNOSIS — Z1231 Encounter for screening mammogram for malignant neoplasm of breast: Secondary | ICD-10-CM | POA: Diagnosis not present

## 2023-08-11 ENCOUNTER — Other Ambulatory Visit: Payer: Medicare HMO

## 2023-08-11 DIAGNOSIS — Z0000 Encounter for general adult medical examination without abnormal findings: Secondary | ICD-10-CM

## 2023-08-11 DIAGNOSIS — M8580 Other specified disorders of bone density and structure, unspecified site: Secondary | ICD-10-CM | POA: Diagnosis not present

## 2023-08-11 DIAGNOSIS — E782 Mixed hyperlipidemia: Secondary | ICD-10-CM | POA: Diagnosis not present

## 2023-08-11 DIAGNOSIS — Z8719 Personal history of other diseases of the digestive system: Secondary | ICD-10-CM

## 2023-08-11 DIAGNOSIS — E039 Hypothyroidism, unspecified: Secondary | ICD-10-CM | POA: Diagnosis not present

## 2023-08-11 DIAGNOSIS — I10 Essential (primary) hypertension: Secondary | ICD-10-CM | POA: Diagnosis not present

## 2023-08-12 LAB — CBC WITH DIFFERENTIAL/PLATELET
Absolute Lymphocytes: 1352 {cells}/uL (ref 850–3900)
Absolute Monocytes: 406 {cells}/uL (ref 200–950)
Basophils Absolute: 73 {cells}/uL (ref 0–200)
Basophils Relative: 1.4 %
Eosinophils Absolute: 109 {cells}/uL (ref 15–500)
Eosinophils Relative: 2.1 %
HCT: 42.6 % (ref 35.0–45.0)
Hemoglobin: 14 g/dL (ref 11.7–15.5)
MCH: 30.7 pg (ref 27.0–33.0)
MCHC: 32.9 g/dL (ref 32.0–36.0)
MCV: 93.4 fL (ref 80.0–100.0)
MPV: 12 fL (ref 7.5–12.5)
Monocytes Relative: 7.8 %
Neutro Abs: 3260 {cells}/uL (ref 1500–7800)
Neutrophils Relative %: 62.7 %
Platelets: 185 10*3/uL (ref 140–400)
RBC: 4.56 10*6/uL (ref 3.80–5.10)
RDW: 12.1 % (ref 11.0–15.0)
Total Lymphocyte: 26 %
WBC: 5.2 10*3/uL (ref 3.8–10.8)

## 2023-08-12 LAB — LIPID PANEL
Cholesterol: 205 mg/dL — ABNORMAL HIGH (ref <200)
HDL: 61 mg/dL (ref >=50)
LDL Cholesterol (Calc): 120 mg/dL — ABNORMAL HIGH
Non-HDL Cholesterol (Calc): 144 mg/dL — ABNORMAL HIGH (ref <130)
Total CHOL/HDL Ratio: 3.4 (calc) (ref <5.0)
Triglycerides: 126 mg/dL (ref <150)

## 2023-08-12 LAB — COMPLETE METABOLIC PANEL WITH GFR
AG Ratio: 1.9 (calc) (ref 1.0–2.5)
ALT: 37 U/L — ABNORMAL HIGH (ref 6–29)
AST: 30 U/L (ref 10–35)
Albumin: 4.4 g/dL (ref 3.6–5.1)
Alkaline phosphatase (APISO): 80 U/L (ref 37–153)
BUN: 17 mg/dL (ref 7–25)
CO2: 32 mmol/L (ref 20–32)
Calcium: 9.3 mg/dL (ref 8.6–10.4)
Chloride: 101 mmol/L (ref 98–110)
Creat: 0.72 mg/dL (ref 0.60–1.00)
Globulin: 2.3 g/dL (ref 1.9–3.7)
Glucose, Bld: 101 mg/dL — ABNORMAL HIGH (ref 65–99)
Potassium: 4.4 mmol/L (ref 3.5–5.3)
Sodium: 139 mmol/L (ref 135–146)
Total Bilirubin: 0.6 mg/dL (ref 0.2–1.2)
Total Protein: 6.7 g/dL (ref 6.1–8.1)
eGFR: 88 mL/min/{1.73_m2} (ref >=60)

## 2023-08-12 LAB — TSH: TSH: 3 m[IU]/L (ref 0.40–4.50)

## 2023-08-14 ENCOUNTER — Ambulatory Visit: Payer: Medicare HMO | Admitting: Internal Medicine

## 2023-08-14 ENCOUNTER — Encounter: Payer: Self-pay | Admitting: Internal Medicine

## 2023-08-14 VITALS — BP 120/88 | HR 72 | Ht 67.0 in | Wt 225.0 lb

## 2023-08-14 DIAGNOSIS — Z87898 Personal history of other specified conditions: Secondary | ICD-10-CM | POA: Diagnosis not present

## 2023-08-14 DIAGNOSIS — Z8719 Personal history of other diseases of the digestive system: Secondary | ICD-10-CM

## 2023-08-14 DIAGNOSIS — M8580 Other specified disorders of bone density and structure, unspecified site: Secondary | ICD-10-CM

## 2023-08-14 DIAGNOSIS — E039 Hypothyroidism, unspecified: Secondary | ICD-10-CM

## 2023-08-14 DIAGNOSIS — E782 Mixed hyperlipidemia: Secondary | ICD-10-CM

## 2023-08-14 DIAGNOSIS — Z860101 Personal history of adenomatous and serrated colon polyps: Secondary | ICD-10-CM

## 2023-08-14 DIAGNOSIS — Z0000 Encounter for general adult medical examination without abnormal findings: Secondary | ICD-10-CM

## 2023-08-14 DIAGNOSIS — Z23 Encounter for immunization: Secondary | ICD-10-CM | POA: Diagnosis not present

## 2023-08-14 LAB — POCT URINALYSIS DIP (CLINITEK)
Bilirubin, UA: NEGATIVE
Blood, UA: NEGATIVE
Glucose, UA: NEGATIVE mg/dL
Ketones, POC UA: NEGATIVE mg/dL
Leukocytes, UA: NEGATIVE
Nitrite, UA: NEGATIVE
POC PROTEIN,UA: NEGATIVE
Spec Grav, UA: 1.01 (ref 1.010–1.025)
Urobilinogen, UA: 0.2 U/dL
pH, UA: 7 (ref 5.0–8.0)

## 2023-08-23 MED ORDER — MELOXICAM 15 MG PO TABS: 15.0000 mg | ORAL_TABLET | Freq: Every day | ORAL | 3 refills | Status: DC

## 2023-08-23 NOTE — Patient Instructions (Signed)
It was a pleasure to see you today.  Labs are stable.  Blood pressure is under good control on no medication.  TSH is 3.  We will continue to monitor your thyroid functions annually.  Vaccines discussed.  Cologuard negative in January 2023.  Discussed lipid panel results and she does not want to be on statin medication.  Takes Mobic for musculoskeletal pain.  Mammogram was normal November 2024.  May return in 1 year or as needed.

## 2024-06-29 ENCOUNTER — Other Ambulatory Visit: Payer: Self-pay | Admitting: Internal Medicine

## 2024-06-29 DIAGNOSIS — Z1231 Encounter for screening mammogram for malignant neoplasm of breast: Secondary | ICD-10-CM

## 2024-08-09 ENCOUNTER — Ambulatory Visit
Admission: RE | Admit: 2024-08-09 | Discharge: 2024-08-09 | Disposition: A | Discharge status: Home / Self Care | Source: Ambulatory Visit | Attending: Internal Medicine | Admitting: Internal Medicine

## 2024-08-09 DIAGNOSIS — Z1231 Encounter for screening mammogram for malignant neoplasm of breast: Secondary | ICD-10-CM

## 2024-08-10 NOTE — Progress Notes (Signed)
 Annual Wellness Visit   Patient Care Team: Orvell Careaga, Ronal PARAS, MD as PCP - General Dann Candyce RAMAN, MD as PCP - Cardiology (Cardiology)  Visit Date: 08/17/24   Chief Complaint  Patient presents with   Annual Exam   Medicare Wellness   Subjective:  Patient: Anna Sanchez, Female DOB: Apr 01, 1951, 73 y.o. MRN: [id number] There were no vitals filed for this visit. Archita Lomeli is a 73 y.o. Female who presents today for her Annual Wellness Visit. Patient has Dyslexia; Insomnia; Osteopenia; Dependent edema; Essential hypertension, benign; Hyperlipidemia; and Intertrigo on their problem list.   She has papular lesions bellow her nose that have been bothering her. She has been treating it by putting rubbing alcohol on it.   She said that she has been experiencing neuropathy in her legs and she will have to steady herself in the morning.  TSH is normal. Shed Does not think she has B12 deficiency. We can check if she so desires. Does not have paresthesias but more of balance issue. Continues gardening and has active lifestyle.  She works as a agricultural engineer. She gets a fair amount of exercise. Tries to eat healthy and watches her diet.   History of Hyperlipidemia, 08/13/2024 Lipid Panel LDL 108 otherwise WNL.   History of insomnia, dyslexia, dependent edema and osteopenia.     Diagnosed with hypothyroidism in 2017.  Currently not on thyroid  replacement medicine and TSH is 2.58 .   She has musculoskeletal pain from gardening and is currently on Mobic  15 mg daily.  Has taken Flexeril  in the past intermittently and Naprosyn or Aleve.     History of chronic right and left knee pain- likely has osteoarthritis. Does not want to see orthopedist.   Past medical history: Total abdominal hysterectomy with BSO in 1990.  Left plantar fasciitis in the past.   History of irritable bowel syndrome intermittently.   History of trigger finger right middle finger seen by Dr. Murrell.  Labs 08/13/2024  Blood gluose 103, LDL 108, Otherwise WNL  09/02/2013 Colonoscopy Sessile polyp measuring 3 mm in size was found in ascending colon; Mild diverticulosis was noted in the sigmoid colon. The colon was otherwise normal. Repeat in 5 years.    08/09/2024 no mammographic evidence of malignancy. Repeat in one year.   Vaccine counseling: UTD on Covid-19, Influenza vaccines.  Health Maintenance  Topic Date Due   Medicare Annual Wellness (AWV)  08/13/2024   COVID-19 Vaccine (10 - 2025-26 season) 09/02/2024 (Originally 05/24/2024)   DTaP/Tdap/Td (3 - Td or Tdap) 04/12/2025   Mammogram  08/09/2026   Colonoscopy  09/27/2026   Pneumococcal Vaccine: 50+ Years  Completed   Influenza Vaccine  Completed   Bone Density Scan  Completed   Zoster Vaccines- Shingrix  Completed   Meningococcal B Vaccine  Aged Out   Hepatitis C Screening  Discontinued     Review of Systems  Constitutional:  Negative for fever and malaise/fatigue.  HENT:  Negative for congestion.   Eyes:  Negative for blurred vision.  Respiratory:  Negative for cough and shortness of breath.   Cardiovascular:  Negative for chest pain, palpitations and leg swelling.  Gastrointestinal:  Negative for vomiting.  Musculoskeletal:  Negative for back pain.  Skin:  Negative for rash.  Neurological:  Negative for loss of consciousness and headaches.   Objective:  Vitals: body mass index is 35.98 kg/m. Today's Vitals   08/17/24 1104  Pulse: 77  SpO2: 98%  Weight: 228 lb (103.4 kg)  Height: 5' 6.75 (1.695 m)  PainSc: 4   PainLoc: Generalized   Physical Exam Vitals and nursing note reviewed.  Constitutional:      General: She is not in acute distress.    Appearance: Normal appearance. She is not ill-appearing or toxic-appearing.  HENT:     Head: Normocephalic and atraumatic.     Right Ear: Hearing, tympanic membrane, ear canal and external ear normal.     Left Ear: Hearing, tympanic membrane, ear canal and external ear normal.      Mouth/Throat:     Pharynx: Oropharynx is clear.  Eyes:     Extraocular Movements: Extraocular movements intact.     Pupils: Pupils are equal, round, and reactive to light.  Neck:     Thyroid : No thyroid  mass, thyromegaly or thyroid  tenderness.     Vascular: No carotid bruit.  Cardiovascular:     Rate and Rhythm: Normal rate and regular rhythm. No extrasystoles are present.    Pulses:          Dorsalis pedis pulses are 2+ on the right side and 2+ on the left side.     Heart sounds: Normal heart sounds. No murmur heard.    No friction rub. No gallop.  Pulmonary:     Effort: Pulmonary effort is normal.     Breath sounds: Normal breath sounds. No decreased breath sounds, wheezing, rhonchi or rales.  Chest:     Chest wall: No mass.  Abdominal:     Palpations: Abdomen is soft. There is no hepatomegaly, splenomegaly or mass.     Tenderness: There is no abdominal tenderness.     Hernia: No hernia is present.  Genitourinary:    Comments: GYN declined.  Musculoskeletal:     Cervical back: Normal range of motion.     Right lower leg: No edema.     Left lower leg: No edema.  Lymphadenopathy:     Cervical: No cervical adenopathy.     Upper Body:     Right upper body: No supraclavicular adenopathy.     Left upper body: No supraclavicular adenopathy.  Skin:    General: Skin is warm and dry.     Findings: Lesion present.     Comments: Papular lesion.   Neurological:     General: No focal deficit present.     Mental Status: She is alert and oriented to person, place, and time. Mental status is at baseline.     Sensory: Sensation is intact.     Motor: Motor function is intact. No weakness.     Deep Tendon Reflexes: Reflexes are normal and symmetric.  Psychiatric:        Attention and Perception: Attention normal.        Mood and Affect: Mood normal.        Speech: Speech normal.        Behavior: Behavior normal.        Thought Content: Thought content normal.        Cognition and  Memory: Cognition normal.        Judgment: Judgment normal.     Current Outpatient Medications  Medication Instructions   b complex vitamins capsule 1 capsule, Daily   cholecalciferol (VITAMIN D3) 1,000 Units, Daily   diphenhydrAMINE (BENADRYL) 25 mg, At bedtime PRN   Fish Oil 300 mg, Daily   magnesium citrate SOLN 1 Bottle,  Once   meloxicam  (MOBIC ) 15 mg, Oral, Daily   Multiple Vitamin (MULTIVITAMIN) tablet 1 tablet, Daily  mupirocin  ointment (BACTROBAN ) 2 % 1 Application, Topical, 2 times daily   naproxen sodium (ALEVE) 220 mg, Daily PRN   Probiotic Product (PROBIOTIC DAILY PO) Daily   psyllium (METAMUCIL SMOOTH TEXTURE) 28 % packet 1 packet, 2 times daily   TURMERIC PO Daily   Past Medical History:  Diagnosis Date   Dependent edema    Dyslexia    History of IBS    HTN (hypertension)    Hx of adenomatous polyp of colon    Hyperlipidemia    Hypothyroidism    Insomnia    Osteopenia    Medical/Surgical History Narrative:  Allergic/Intolerant to: No Known Allergies  Past Surgical History:  Procedure Laterality Date   ABDOMINAL HYSTERECTOMY     Family History  Problem Relation Age of Onset   Diabetes Father    Alcohol abuse Father    Pancreatic cancer Sister    Liver cancer Brother    Colon cancer Neg Hx    Stomach cancer Neg Hx    BRCA 1/2 Neg Hx    Breast cancer Neg Hx    Family History Narrative: Father with history of diabetes but died of cirrhosis of the liver at age 67. Mother with history of nervous breakdown, headaches and suicide. Sister with history of suicide in her 30s. She has 2 sisters living 1 of whom has had a brain aneurysm and has adult onset diabetes. 1 brother with history of hepatitis C. 1 brother died in a motorcycle accident at age 74. 1 brother with history of coronary artery disease and alcoholism. 1 brother with history of prostate cancer.    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 She quit smoking around 2004. Previously smoked 1/2  to 1 pack of cigarettes daily for some 33 years. Occasional social alcohol consumption. She is a native of [place] . She moved from [place]  to [place] in the 1970s and came to [place] in the 1980s.   Most Recent Health Risks Assessment:   Most Recent Social Determinants of Health (Including Hx of Tobacco, Alcohol, and Drug Use) SDOH Screenings   Food Insecurity: No Food Insecurity (08/17/2024)  Housing: Low Risk  (08/17/2024)  Transportation Needs: No Transportation Needs (08/17/2024)  Utilities: Not At Risk (08/17/2024)  Alcohol Screen: Low Risk  (08/17/2024)  Depression (PHQ2-9): Low Risk  (08/17/2024)  Financial Resource Strain: Low Risk  (08/17/2024)  Physical Activity: Insufficiently Active (08/17/2024)  Social Connections: Socially Isolated (08/17/2024)  Stress: No Stress Concern Present (08/17/2024)  Tobacco Use: Medium Risk (08/17/2024)  Health Literacy: Adequate Health Literacy (08/17/2024)   Social History   Tobacco Use   Smoking status: Former    Current packs/day: 0.00    Average packs/day: 1 pack/day for 33.0 years (33.0 ttl pk-yrs)    Types: Cigarettes    Start date: 02/20/1967    Quit date: 02/20/2000    Years since quitting: 24.5   Smokeless tobacco: Former    Quit date: 10/12/2010  Vaping Use   Vaping status: Never Used  Substance Use Topics   Alcohol use: Yes    Alcohol/week: 7.0 standard drinks of alcohol    Types: 7 Glasses of wine per week    Comment: glass of wine or one beer at dinner    Drug use: No    Most Recent Fall Risk Assessment:    08/17/2024   11:04 AM  Fall Risk   Falls in the past year? 0  Number falls in past yr: 0  Injury with Fall? 0  Risk for fall due to : No Fall Risks  Follow up Falls prevention discussed;Education provided;Falls evaluation completed   Most Recent Anxiety/Depression Screenings:    08/17/2024   11:01 AM 08/14/2023   11:13 AM  PHQ 2/9 Scores  PHQ - 2 Score 1 1  PHQ- 9 Score 2       08/17/2024    11:01 AM  GAD 7 : Generalized Anxiety Score  Nervous, Anxious, on Edge 2  Control/stop worrying 2  Worry too much - different things 1  Trouble relaxing 0  Restless 0  Easily annoyed or irritable 1  Afraid - awful might happen 0  Total GAD 7 Score 6  Anxiety Difficulty Not difficult at all   Most Recent Cognitive Screening:    08/17/2024   11:04 AM  6CIT Screen  What Year? 0 points  What month? 0 points  What time? 0 points  Count back from 20 0 points  Months in reverse 0 points  Repeat phrase 0 points  Total Score 0 points    Results:  Studies Obtained And Personally Reviewed By Me:  09/02/2013 Colonoscopy Sessile polyp measuring 3 mm in size was found in ascending colon; Mild diverticulosis was noted in the sigmoid colon. The colon was otherwise normal. Repeat in 5 years.    08/09/2024 no mammographic evidence of malignancy. Repeat in one year.    Labs:  CBC w/ Differential Lab Results  Component Value Date   WBC 9.5 08/13/2024   RBC 4.53 08/13/2024   HGB 13.9 08/13/2024   HCT 42.0 08/13/2024   PLT 194 08/13/2024   MCV 92.7 08/13/2024   MCH 30.7 08/13/2024   MCHC 33.1 08/13/2024   RDW 11.9 08/13/2024   MPV 11.9 08/13/2024   LYMPHSABS 1,247 08/05/2022   MONOABS 392 04/21/2017   BASOSABS 57 08/13/2024    Comprehensive Metabolic Panel Lab Results  Component Value Date   NA 138 08/13/2024   K 5.2 08/13/2024   CL 103 08/13/2024   CO2 28 08/13/2024   GLUCOSE 103 (H) 08/13/2024   BUN 21 08/13/2024   CREATININE 0.73 08/13/2024   CALCIUM 9.0 08/13/2024   PROT 6.5 08/13/2024   ALBUMIN 4.6 04/21/2017   AST 27 08/13/2024   ALT 25 08/13/2024   ALKPHOS 61 04/21/2017   BILITOT 0.6 08/13/2024   EGFR 86 08/13/2024   GFRNONAA 75 06/06/2020   Lipid Panel  Lab Results  Component Value Date   CHOL 186 08/13/2024   HDL 56 08/13/2024   LDLCALC 108 (H) 08/13/2024   TRIG 108 08/13/2024   A1c No results found for: HGBA1C  TSH Lab Results  Component Value  Date   TSH 2.58 08/13/2024    Assessment & Plan:   Orders Placed This Encounter  Procedures   POCT URINALYSIS DIP (CLINITEK)   Meds ordered this encounter  Medications   mupirocin  ointment (BACTROBAN ) 2 %    Sig: Apply 1 Application topically 2 (two) times daily.    Dispense:  22 g    Refill:  0   Papular Lesions: She has papular lesions bellow her nose that have been bothering her. She has been treating it by putting rubbing alcohol on it.    Mupirocin  ointment applied topically 2 times daily, If her symptoms are not relieved she is agreeable to taking Doxycycline .   She said that she has been experiencing neuropathy in her legs and she will have to steady herself in the morning.    She works as a  gardener. She gets a fair amount of exercise. Tries to eat healthy and watches her diet.   Hyperlipidemia: 08/13/2024 Lipid Panel LDL 108 otherwise WNL.    Hypothyroidism: Currently not on thyroid  replacement medicine and TSH is 2.58.   Musculoskeletal pain: from gardening and is currently on Mobic  15 mg daily.  Has taken Flexeril  in the past intermittently and Naprosyn or Aleve.     History of chronic right and left knee pain.    09/02/2013 Colonoscopy Sessile polyp measuring 3 mm in size was found in ascending colon; Mild diverticulosis was noted in the sigmoid colon. The colon was otherwise normal. Repeat in 5 years.    08/09/2024 no mammographic evidence of malignancy. Repeat in one year.      Annual Wellness Visit done today including the all of the following: Reviewed patient's Family Medical History Reviewed patient's SDOH and reviewed tobacco, alcohol, and drug use.  Reviewed and updated list of patient's medical providers Assessment of cognitive impairment was done Assessed patient's functional ability Established a written schedule for health screening services Health Risk Assessent Completed and Reviewed  Discussed health benefits of physical activity, and  encouraged her to engage in regular exercise appropriate for her age and condition.  Refill Mupirocin  at pt request   I,Makayla C Reid,acting as a scribe for Ronal JINNY Hailstone, MD.,have documented all relevant documentation on the behalf of Ronal JINNY Hailstone, MD,as directed by  Ronal JINNY Hailstone, MD while in the presence of Ronal JINNY Hailstone, MD.  I, Ronal JINNY Hailstone, MD, have reviewed all documentation for and agree with the above Annual Wellness Visit documentation.  Ronal JINNY Hailstone, MD Internal Medicine 08/17/2024

## 2024-08-13 ENCOUNTER — Other Ambulatory Visit

## 2024-08-13 DIAGNOSIS — M8580 Other specified disorders of bone density and structure, unspecified site: Secondary | ICD-10-CM

## 2024-08-13 DIAGNOSIS — Z0000 Encounter for general adult medical examination without abnormal findings: Secondary | ICD-10-CM

## 2024-08-13 DIAGNOSIS — Z1329 Encounter for screening for other suspected endocrine disorder: Secondary | ICD-10-CM

## 2024-08-13 DIAGNOSIS — E039 Hypothyroidism, unspecified: Secondary | ICD-10-CM | POA: Diagnosis not present

## 2024-08-13 DIAGNOSIS — E782 Mixed hyperlipidemia: Secondary | ICD-10-CM

## 2024-08-13 LAB — COMPREHENSIVE METABOLIC PANEL WITH GFR
AG Ratio: 2 (calc) (ref 1.0–2.5)
ALT: 25 U/L (ref 6–29)
AST: 27 U/L (ref 10–35)
Albumin: 4.3 g/dL (ref 3.6–5.1)
Alkaline phosphatase (APISO): 67 U/L (ref 37–153)
BUN: 21 mg/dL (ref 7–25)
CO2: 28 mmol/L (ref 20–32)
Calcium: 9 mg/dL (ref 8.6–10.4)
Chloride: 103 mmol/L (ref 98–110)
Creat: 0.73 mg/dL (ref 0.60–1.00)
Globulin: 2.2 g/dL (ref 1.9–3.7)
Glucose, Bld: 103 mg/dL — ABNORMAL HIGH (ref 65–99)
Potassium: 5.2 mmol/L (ref 3.5–5.3)
Sodium: 138 mmol/L (ref 135–146)
Total Bilirubin: 0.6 mg/dL (ref 0.2–1.2)
Total Protein: 6.5 g/dL (ref 6.1–8.1)
eGFR: 86 mL/min/1.73m2 (ref >=60)

## 2024-08-13 LAB — TSH: TSH: 2.58 m[IU]/L (ref 0.40–4.50)

## 2024-08-13 LAB — CBC WITH DIFFERENTIAL/PLATELET
Absolute Lymphocytes: 1188 {cells}/uL (ref 850–3900)
Absolute Monocytes: 523 {cells}/uL (ref 200–950)
Basophils Absolute: 57 {cells}/uL (ref 0–200)
Basophils Relative: 0.6 %
Eosinophils Absolute: 124 {cells}/uL (ref 15–500)
Eosinophils Relative: 1.3 %
HCT: 42 % (ref 35.0–45.0)
Hemoglobin: 13.9 g/dL (ref 11.7–15.5)
MCH: 30.7 pg (ref 27.0–33.0)
MCHC: 33.1 g/dL (ref 32.0–36.0)
MCV: 92.7 fL (ref 80.0–100.0)
MPV: 11.9 fL (ref 7.5–12.5)
Monocytes Relative: 5.5 %
Neutro Abs: 7610 {cells}/uL (ref 1500–7800)
Neutrophils Relative %: 80.1 %
Platelets: 194 Thousand/uL (ref 140–400)
RBC: 4.53 Million/uL (ref 3.80–5.10)
RDW: 11.9 % (ref 11.0–15.0)
Total Lymphocyte: 12.5 %
WBC: 9.5 Thousand/uL (ref 3.8–10.8)

## 2024-08-13 LAB — LIPID PANEL
Cholesterol: 186 mg/dL (ref <200)
HDL: 56 mg/dL (ref >=50)
LDL Cholesterol (Calc): 108 mg/dL — ABNORMAL HIGH
Non-HDL Cholesterol (Calc): 130 mg/dL — ABNORMAL HIGH (ref <130)
Total CHOL/HDL Ratio: 3.3 (calc) (ref <5.0)
Triglycerides: 108 mg/dL (ref <150)

## 2024-08-16 ENCOUNTER — Other Ambulatory Visit: Payer: Medicare HMO

## 2024-08-16 DIAGNOSIS — H2513 Age-related nuclear cataract, bilateral: Secondary | ICD-10-CM | POA: Diagnosis not present

## 2024-08-16 DIAGNOSIS — H524 Presbyopia: Secondary | ICD-10-CM | POA: Diagnosis not present

## 2024-08-16 DIAGNOSIS — H5203 Hypermetropia, bilateral: Secondary | ICD-10-CM | POA: Diagnosis not present

## 2024-08-17 ENCOUNTER — Encounter: Payer: Self-pay | Admitting: Internal Medicine

## 2024-08-17 ENCOUNTER — Ambulatory Visit: Payer: Medicare HMO | Admitting: Internal Medicine

## 2024-08-17 VITALS — HR 77 | Ht 66.75 in | Wt 228.0 lb

## 2024-08-17 DIAGNOSIS — E785 Hyperlipidemia, unspecified: Secondary | ICD-10-CM

## 2024-08-17 DIAGNOSIS — M6530 Trigger finger, unspecified finger: Secondary | ICD-10-CM

## 2024-08-17 DIAGNOSIS — M8580 Other specified disorders of bone density and structure, unspecified site: Secondary | ICD-10-CM

## 2024-08-17 DIAGNOSIS — M7918 Myalgia, other site: Secondary | ICD-10-CM

## 2024-08-17 DIAGNOSIS — Z0000 Encounter for general adult medical examination without abnormal findings: Secondary | ICD-10-CM | POA: Diagnosis not present

## 2024-08-17 DIAGNOSIS — Z860101 Personal history of adenomatous and serrated colon polyps: Secondary | ICD-10-CM

## 2024-08-17 DIAGNOSIS — G8929 Other chronic pain: Secondary | ICD-10-CM

## 2024-08-17 DIAGNOSIS — E782 Mixed hyperlipidemia: Secondary | ICD-10-CM

## 2024-08-17 DIAGNOSIS — L449 Papulosquamous disorder, unspecified: Secondary | ICD-10-CM

## 2024-08-17 DIAGNOSIS — E039 Hypothyroidism, unspecified: Secondary | ICD-10-CM | POA: Diagnosis not present

## 2024-08-17 DIAGNOSIS — Z8719 Personal history of other diseases of the digestive system: Secondary | ICD-10-CM

## 2024-08-17 DIAGNOSIS — Z87898 Personal history of other specified conditions: Secondary | ICD-10-CM

## 2024-08-17 LAB — POCT URINALYSIS DIP (CLINITEK)
Bilirubin, UA: NEGATIVE
Blood, UA: NEGATIVE
Glucose, UA: NEGATIVE mg/dL
Ketones, POC UA: NEGATIVE mg/dL
Leukocytes, UA: NEGATIVE
Nitrite, UA: NEGATIVE
POC PROTEIN,UA: NEGATIVE
Spec Grav, UA: 1.01 (ref 1.010–1.025)
Urobilinogen, UA: 0.2 U/dL
pH, UA: 7 (ref 5.0–8.0)

## 2024-08-17 MED ORDER — MUPIROCIN 2 % EX OINT: 1.0000 | TOPICAL_OINTMENT | Freq: Two times a day (BID) | CUTANEOUS | 0 refills | Status: AC

## 2024-08-17 NOTE — Patient Instructions (Addendum)
 Anna Sanchez,  Thank you for taking the time for your Medicare Wellness Visit. I appreciate your continued commitment to your health goals. Please review the care plan we discussed, and feel free to reach out if I can assist you further.  Please note that Annual Wellness Visits do not include a physical exam. Some assessments may be limited, especially if the visit was conducted virtually. If needed, we may recommend an in-person follow-up with your provider.  Ongoing Care Seeing your primary care provider every 3 to 6 months helps us  monitor your health and provide consistent, personalized care.   Referrals If a referral was made during today's visit and you haven't received any updates within two weeks, please contact the referred provider directly to check on the status.  Recommended Screenings:  Health Maintenance  Topic Date Due   COVID-19 Vaccine (10 - 2025-26 season) 09/02/2024*   DTaP/Tdap/Td vaccine (3 - Td or Tdap) 04/12/2025   Medicare Annual Wellness Visit  08/17/2025   Breast Cancer Screening  08/09/2026   Colon Cancer Screening  09/27/2026   Pneumococcal Vaccine for age over 86  Completed   Flu Shot  Completed   Osteoporosis screening with Bone Density Scan  Completed   Zoster (Shingles) Vaccine  Completed   Meningitis B Vaccine  Aged Out   Hepatitis C Screening  Discontinued  *Topic was postponed. The date shown is not the original due date.       08/17/2024   11:04 AM  Advanced Directives  Does Patient Have a Medical Advance Directive? No  Would patient like information on creating a medical advance directive? No - Patient declined    Vision: Annual vision screenings are recommended for early detection of glaucoma, cataracts, and diabetic retinopathy. These exams can also reveal signs of chronic conditions such as diabetes and high blood pressure.  Dental: Annual dental screenings help detect early signs of oral cancer, gum disease, and other conditions linked to  overall health, including heart disease and diabetes.  Please see the attached documents for additional preventive care recommendations.     Next appointment: Follow up in one year for your annual wellness visit    Preventive Care 65 Years and Older, Female Preventive care refers to lifestyle choices and visits with your health care provider that can promote health and wellness. What does preventive care include? A yearly physical exam. This is also called an annual well check. Dental exams once or twice a year. Routine eye exams. Ask your health care provider how often you should have your eyes checked. Personal lifestyle choices, including: Daily care of your teeth and gums. Regular physical activity. Eating a healthy diet. Avoiding tobacco and drug use. Limiting alcohol use. Practicing safe sex. Taking low-dose aspirin every day. Taking vitamin and mineral supplements as recommended by your health care provider. What happens during an annual well check? The services and screenings done by your health care provider during your annual well check will depend on your age, overall health, lifestyle risk factors, and family history of disease. Counseling  Your health care provider may ask you questions about your: Alcohol use. Tobacco use. Drug use. Emotional well-being. Home and relationship well-being. Sexual activity. Eating habits. History of falls. Memory and ability to understand (cognition). Work and work astronomer. Reproductive health. Screening  You may have the following tests or measurements: Height, weight, and BMI. Blood pressure. Lipid and cholesterol levels. These may be checked every 5 years, or more frequently if you are over 50  years old. Skin check. Lung cancer screening. You may have this screening every year starting at age 65 if you have a 30-pack-year history of smoking and currently smoke or have quit within the past 15 years. Fecal occult blood  test (FOBT) of the stool. You may have this test every year starting at age 30. Flexible sigmoidoscopy or colonoscopy. You may have a sigmoidoscopy every 5 years or a colonoscopy every 10 years starting at age 17. Hepatitis C blood test. Hepatitis B blood test. Sexually transmitted disease (STD) testing. Diabetes screening. This is done by checking your blood sugar (glucose) after you have not eaten for a while (fasting). You may have this done every 1-3 years. Bone density scan. This is done to screen for osteoporosis. You may have this done starting at age 47. Mammogram. This may be done every 1-2 years. Talk to your health care provider about how often you should have regular mammograms. Talk with your health care provider about your test results, treatment options, and if necessary, the need for more tests. Vaccines  Your health care provider may recommend certain vaccines, such as: Influenza vaccine. This is recommended every year. Tetanus, diphtheria, and acellular pertussis (Tdap, Td) vaccine. You may need a Td booster every 10 years. Zoster vaccine. You may need this after age 69. Pneumococcal 13-valent conjugate (PCV13) vaccine. One dose is recommended after age 68. Pneumococcal polysaccharide (PPSV23) vaccine. One dose is recommended after age 30. Talk to your health care provider about which screenings and vaccines you need and how often you need them. This information is not intended to replace advice given to you by your health care provider. Make sure you discuss any questions you have with your health care provider. Document Released: 10/06/2015 Document Revised: 05/29/2016 Document Reviewed: 07/11/2015 Elsevier Interactive Patient Education  2017 [employer].  Fall Prevention in the Home Falls can cause injuries. They can happen to people of all ages. There are many things you can do to make your home safe and to help prevent falls. What can I do on the outside of my  home? Regularly fix the edges of walkways and driveways and fix any cracks. Remove anything that might make you trip as you walk through a door, such as a raised step or threshold. Trim any bushes or trees on the path to your home. Use bright outdoor lighting. Clear any walking paths of anything that might make someone trip, such as rocks or tools. Regularly check to see if handrails are loose or broken. Make sure that both sides of any steps have handrails. Any raised decks and porches should have guardrails on the edges. Have any leaves, snow, or ice cleared regularly. Use sand or salt on walking paths during winter. Clean up any spills in your garage right away. This includes oil or grease spills. What can I do in the bathroom? Use night lights. Install grab bars by the toilet and in the tub and shower. Do not use towel bars as grab bars. Use non-skid mats or decals in the tub or shower. If you need to sit down in the shower, use a plastic, non-slip stool. Keep the floor dry. Clean up any water that spills on the floor as soon as it happens. Remove soap buildup in the tub or shower regularly. Attach bath mats securely with double-sided non-slip rug tape. Do not have throw rugs and other things on the floor that can make you trip. What can I do in the bedroom? Use  night lights. Make sure that you have a light by your bed that is easy to reach. Do not use any sheets or blankets that are too big for your bed. They should not hang down onto the floor. Have a firm chair that has side arms. You can use this for support while you get dressed. Do not have throw rugs and other things on the floor that can make you trip. What can I do in the kitchen? Clean up any spills right away. Avoid walking on wet floors. Keep items that you use a lot in easy-to-reach places. If you need to reach something above you, use a strong step stool that has a grab bar. Keep electrical cords out of the way. Do  not use floor polish or wax that makes floors slippery. If you must use wax, use non-skid floor wax. Do not have throw rugs and other things on the floor that can make you trip. What can I do with my stairs? Do not leave any items on the stairs. Make sure that there are handrails on both sides of the stairs and use them. Fix handrails that are broken or loose. Make sure that handrails are as long as the stairways. Check any carpeting to make sure that it is firmly attached to the stairs. Fix any carpet that is loose or worn. Avoid having throw rugs at the top or bottom of the stairs. If you do have throw rugs, attach them to the floor with carpet tape. Make sure that you have a light switch at the top of the stairs and the bottom of the stairs. If you do not have them, ask someone to add them for you. What else can I do to help prevent falls? Wear shoes that: Do not have high heels. Have rubber bottoms. Are comfortable and fit you well. Are closed at the toe. Do not wear sandals. If you use a stepladder: Make sure that it is fully opened. Do not climb a closed stepladder. Make sure that both sides of the stepladder are locked into place. Ask someone to hold it for you, if possible. Clearly mark and make sure that you can see: Any grab bars or handrails. First and last steps. Where the edge of each step is. Use tools that help you move around (mobility aids) if they are needed. These include: Canes. Walkers. Scooters. Crutches. Turn on the lights when you go into a dark area. Replace any light bulbs as soon as they burn out. Set up your furniture so you have a clear path. Avoid moving your furniture around. If any of your floors are uneven, fix them. If there are any pets around you, be aware of where they are. Review your medicines with your doctor. Some medicines can make you feel dizzy. This can increase your chance of falling. Ask your doctor what other things that you can do to  help prevent falls. This information is not intended to replace advice given to you by your health care provider. Make sure you discuss any questions you have with your health care provider. Document Released: 07/06/2009 Document Revised: 02/15/2016 Document Reviewed: 10/14/2014 Elsevier Interactive Patient Education  2017 [employer].  It was a pleasure to see you today. Continue current medications as directed. Continue to be physically active. Return in one year or as needed.

## 2024-08-17 NOTE — Progress Notes (Addendum)
 Chief Complaint  Patient presents with   Annual Exam   Medicare Wellness     Subjective:   Anna Sanchez is a 73 y.o. female who presents for a Medicare Annual Wellness Visit.  Allergies (verified) Patient has no known allergies.   History: Past Medical History:  Diagnosis Date   Dependent edema    Dyslexia    History of IBS    HTN (hypertension)    Hx of adenomatous polyp of colon    Hyperlipidemia    Hypothyroidism    Insomnia    Osteopenia    Past Surgical History:  Procedure Laterality Date   ABDOMINAL HYSTERECTOMY     Family History  Problem Relation Age of Onset   Diabetes Father    Alcohol abuse Father    Pancreatic cancer Sister    Liver cancer Brother    Colon cancer Neg Hx    Stomach cancer Neg Hx    BRCA 1/2 Neg Hx    Breast cancer Neg Hx    Social History   Occupational History   Not on file  Tobacco Use   Smoking status: Former    Current packs/day: 0.00    Average packs/day: 1 pack/day for 33.0 years (33.0 ttl pk-yrs)    Types: Cigarettes    Start date: 02/20/1967    Quit date: 02/20/2000    Years since quitting: 24.5   Smokeless tobacco: Former    Quit date: 10/12/2010  Vaping Use   Vaping status: Never Used  Substance and Sexual Activity   Alcohol use: Yes    Alcohol/week: 7.0 standard drinks of alcohol    Types: 7 Glasses of wine per week    Comment: glass of wine or one beer at dinner    Drug use: No   Sexual activity: Never   Tobacco Counseling Counseling given: Yes  SDOH Screenings   Food Insecurity: No Food Insecurity (08/17/2024)  Housing: Low Risk  (08/17/2024)  Transportation Needs: No Transportation Needs (08/17/2024)  Utilities: Not At Risk (08/17/2024)  Alcohol Screen: Low Risk  (08/17/2024)  Depression (PHQ2-9): Low Risk  (08/17/2024)  Financial Resource Strain: Low Risk  (08/17/2024)  Physical Activity: Insufficiently Active (08/17/2024)  Social Connections: Socially Isolated (08/17/2024)  Stress: No  Stress Concern Present (08/17/2024)  Tobacco Use: Medium Risk (08/17/2024)  Health Literacy: Adequate Health Literacy (08/17/2024)   See flowsheets for full screening details  Depression Screen PHQ 2 & 9 Depression Scale- Over the past 2 weeks, how often have you been bothered by any of the following problems? Little interest or pleasure in doing things: 0 Feeling down, depressed, or hopeless (PHQ Adolescent also includes...irritable): 1 PHQ-2 Total Score: 1 Trouble falling or staying asleep, or sleeping too much: 1 Poor appetite or overeating (PHQ Adolescent also includes...weight loss): 0 Feeling bad about yourself - or that you are a failure or have let yourself or your family down: 0 Trouble concentrating on things, such as reading the newspaper or watching television (PHQ Adolescent also includes...like school work): 0 Moving or speaking so slowly that other people could have noticed. Or the opposite - being so fidgety or restless that you have been moving around a lot more than usual: 0 Thoughts that you would be better off dead, or of hurting yourself in some way: 0 PHQ-9 Total Score: 2 If you checked off any problems, how difficult have these problems made it for you to do your work, take care of things at home, or get along with  other people?: Not difficult at all     Goals Addressed   None    Visit info / Clinical Intake: Medicare Wellness Visit Type:: Subsequent Annual Wellness Visit Persons participating in visit:: patient Medicare Wellness Visit Mode:: In-person (required for WTM) Information given by:: patient Interpreter Needed?: No Pre-visit prep was completed: yes AWV questionnaire completed by patient prior to visit?: no Living arrangements:: (!) lives alone Patient's Overall Health Status Rating: very good Typical amount of pain: some (Has arthritis) Does pain affect daily life?: no Are you currently prescribed opioids?: no  Dietary Habits and Nutritional  Risks How many meals a day?: 3 Eats fruit and vegetables daily?: yes Most meals are obtained by: preparing own meals In the last 2 weeks, have you had any of the following?: none Diabetic:: no  Functional Status Activities of Daily Living (to include ambulation/medication): Independent Ambulation: Independent Medication Administration: Independent Home Management: Independent Manage your own finances?: yes Primary transportation is: driving Concerns about vision?: no *vision screening is required for WTM* Concerns about hearing?: no  Fall Screening Falls in the past year?: 0 Number of falls in past year: 0 Was there an injury with Fall?: 0 Fall Risk Category Calculator: 0 Patient Fall Risk Level: Low Fall Risk  Fall Risk Patient at Risk for Falls Due to: No Fall Risks Fall risk Follow up: Falls prevention discussed; Education provided; Falls evaluation completed  Home and Transportation Safety: All rugs have non-skid backing?: yes All stairs or steps have railings?: yes Grab bars in the bathtub or shower?: (!) no Have non-skid surface in bathtub or shower?: yes Good home lighting?: yes Regular seat belt use?: yes Hospital stays in the last year:: no  Cognitive Assessment Difficulty concentrating, remembering, or making decisions? : no Will 6CIT or Mini Cog be Completed: yes What year is it?: 0 points What month is it?: 0 points Give patient an address phrase to remember (5 components): [address] About what time is it?: 0 points Count backwards from 20 to 1: 0 points Say the months of the year in reverse: 0 points Repeat the address phrase from earlier: 0 points 6 CIT Score: 0 points  Advance Directives (For Healthcare) Does Patient Have a Medical Advance Directive?: No Would patient like information on creating a medical advance directive?: No - Patient declined  Reviewed/Updated  Reviewed/Updated: Reviewed All (Medical, Surgical, Family, Medications,  Allergies, Care Teams, Patient Goals)        Objective:    Today's Vitals   08/17/24 1104  Pulse: 77  SpO2: 98%  Weight: 228 lb (103.4 kg)  Height: 5' 6.75 (1.695 m)  PainSc: 4   PainLoc: Generalized   Body mass index is 35.98 kg/m.  Current Medications (verified) Outpatient Encounter Medications as of 08/17/2024  Medication Sig   b complex vitamins capsule Take 1 capsule by mouth daily.   cholecalciferol (VITAMIN D3) 25 MCG (1000 UNIT) tablet Take 1,000 Units by mouth daily.   diphenhydrAMINE (BENADRYL) 25 mg capsule Take 25 mg by mouth at bedtime as needed.   magnesium citrate SOLN Take 1 Bottle by mouth once.   meloxicam  (MOBIC ) 15 MG tablet Take 1 tablet (15 mg total) by mouth daily.   Multiple Vitamin (MULTIVITAMIN) tablet Take 1 tablet by mouth daily.     mupirocin  ointment (BACTROBAN ) 2 % Apply 1 Application topically 2 (two) times daily.   naproxen sodium (ALEVE) 220 MG tablet Take 220 mg by mouth daily as needed (PRN).   Omega-3 Fatty Acids (  FISH OIL) 300 MG CAPS Take 300 mg by mouth daily.   Probiotic Product (PROBIOTIC DAILY PO) Take by mouth daily.   psyllium (METAMUCIL SMOOTH TEXTURE) 28 % packet Take 1 packet by mouth 2 (two) times daily.   TURMERIC PO Take by mouth daily.   No facility-administered encounter medications on file as of 08/17/2024.   Hearing/Vision screen No results found. Immunizations and Health Maintenance Health Maintenance  Topic Date Due   Medicare Annual Wellness (AWV)  08/13/2024   COVID-19 Vaccine (10 - 2025-26 season) 09/02/2024 (Originally 05/24/2024)   DTaP/Tdap/Td (3 - Td or Tdap) 04/12/2025   Mammogram  08/09/2026   Colonoscopy  09/27/2026   Pneumococcal Vaccine: 50+ Years  Completed   Influenza Vaccine  Completed   Bone Density Scan  Completed   Zoster Vaccines- Shingrix  Completed   Meningococcal B Vaccine  Aged Out   Hepatitis C Screening  Discontinued        Assessment/Plan:  This is a routine wellness examination  for [place].  Patient Care Team: Perri Ronal PARAS, MD as PCP - General Dann Candyce RAMAN, MD as PCP - Cardiology (Cardiology)  I have personally reviewed and noted the following in the patient's chart:   Medical and social history Use of alcohol, tobacco or illicit drugs  Current medications and supplements including opioid prescriptions. Functional ability and status Nutritional status Physical activity Advanced directives List of other physicians Hospitalizations, surgeries, and ER visits in previous 12 months Vitals Screenings to include cognitive, depression, and falls Referrals and appointments  Orders Placed This Encounter  Procedures   POCT URINALYSIS DIP (CLINITEK)   In addition, I have reviewed and discussed with patient certain preventive protocols, quality metrics, and best practice recommendations. A written personalized care plan for preventive services as well as general preventive health recommendations were provided to patient.   Araceli Zelda, CMA   08/17/2024     After Visit Summary: (In Person-Printed) AVS printed and given to the patient  I, Ronal PARAS Perri, MD, have reviewed all documentation for this visit. The documentation on 08/17/2024 for the exam, diagnosis, procedures, and orders are all accurate and complete.

## 2024-08-21 ENCOUNTER — Encounter: Payer: Self-pay | Admitting: Internal Medicine

## 2024-09-09 ENCOUNTER — Other Ambulatory Visit: Payer: Self-pay | Admitting: Internal Medicine

## 2024-09-09 NOTE — Telephone Encounter (Signed)
 Requested Prescriptions  Pending Prescriptions Disp Refills  meloxicam  (MOBIC ) 15 MG tablet last refill  08/23/2023 #90 + 3 refills.
